# Patient Record
Sex: Female | Born: 1945 | Race: White | Hispanic: No | Marital: Married | Smoking: Never smoker
Health system: Southern US, Community
[De-identification: ages and names within clinical notes are randomized; demographics above are authoritative.]

## PROBLEM LIST (undated history)

## (undated) DIAGNOSIS — I10 Essential (primary) hypertension: Secondary | ICD-10-CM

## (undated) DIAGNOSIS — G309 Alzheimer's disease, unspecified: Secondary | ICD-10-CM

## (undated) DIAGNOSIS — F0280 Dementia in other diseases classified elsewhere without behavioral disturbance: Secondary | ICD-10-CM

## (undated) DIAGNOSIS — K219 Gastro-esophageal reflux disease without esophagitis: Secondary | ICD-10-CM

## (undated) DIAGNOSIS — R569 Unspecified convulsions: Secondary | ICD-10-CM

## (undated) DIAGNOSIS — M1990 Unspecified osteoarthritis, unspecified site: Secondary | ICD-10-CM

## (undated) HISTORY — PX: EYE SURGERY: SHX253

## (undated) HISTORY — PX: COLONOSCOPY: SHX174

## (undated) HISTORY — PX: CHOLECYSTECTOMY: SHX55

## (undated) HISTORY — PX: TONSILLECTOMY: SUR1361

---

## 1999-12-28 HISTORY — PX: CARDIAC CATHETERIZATION: SHX172

## 2000-09-21 ENCOUNTER — Inpatient Hospital Stay (HOSPITAL_COMMUNITY): Admission: EM | Admit: 2000-09-21 | Discharge: 2000-09-23 | Payer: Self-pay

## 2000-09-21 ENCOUNTER — Encounter (INDEPENDENT_AMBULATORY_CARE_PROVIDER_SITE_OTHER): Payer: Self-pay | Admitting: Specialist

## 2000-09-21 ENCOUNTER — Encounter: Payer: Self-pay | Admitting: General Surgery

## 2000-11-27 ENCOUNTER — Encounter: Payer: Self-pay | Admitting: Cardiovascular Disease

## 2000-11-27 ENCOUNTER — Inpatient Hospital Stay (HOSPITAL_COMMUNITY): Admission: EM | Admit: 2000-11-27 | Discharge: 2000-11-28 | Payer: Self-pay | Admitting: Emergency Medicine

## 2003-08-13 ENCOUNTER — Ambulatory Visit (HOSPITAL_BASED_OUTPATIENT_CLINIC_OR_DEPARTMENT_OTHER): Admission: RE | Admit: 2003-08-13 | Discharge: 2003-08-13 | Payer: Self-pay | Admitting: Orthopedic Surgery

## 2003-11-07 ENCOUNTER — Ambulatory Visit (HOSPITAL_BASED_OUTPATIENT_CLINIC_OR_DEPARTMENT_OTHER): Admission: RE | Admit: 2003-11-07 | Discharge: 2003-11-07 | Payer: Self-pay | Admitting: Orthopedic Surgery

## 2003-11-07 ENCOUNTER — Ambulatory Visit (HOSPITAL_COMMUNITY): Admission: RE | Admit: 2003-11-07 | Discharge: 2003-11-07 | Payer: Self-pay | Admitting: Orthopedic Surgery

## 2004-05-22 ENCOUNTER — Ambulatory Visit (HOSPITAL_COMMUNITY): Admission: RE | Admit: 2004-05-22 | Discharge: 2004-05-22 | Payer: Self-pay | Admitting: Family Medicine

## 2004-06-24 ENCOUNTER — Ambulatory Visit (HOSPITAL_COMMUNITY): Admission: RE | Admit: 2004-06-24 | Discharge: 2004-06-24 | Payer: Self-pay | Admitting: Internal Medicine

## 2006-03-30 ENCOUNTER — Ambulatory Visit (HOSPITAL_COMMUNITY): Admission: RE | Admit: 2006-03-30 | Discharge: 2006-03-30 | Payer: Self-pay | Admitting: Family Medicine

## 2006-06-09 ENCOUNTER — Ambulatory Visit (HOSPITAL_COMMUNITY): Admission: RE | Admit: 2006-06-09 | Discharge: 2006-06-09 | Payer: Self-pay | Admitting: Family Medicine

## 2007-12-28 HISTORY — PX: THUMB ARTHROSCOPY: SHX2509

## 2008-07-25 ENCOUNTER — Ambulatory Visit (HOSPITAL_COMMUNITY): Admission: RE | Admit: 2008-07-25 | Discharge: 2008-07-25 | Payer: Self-pay | Admitting: Internal Medicine

## 2008-11-19 ENCOUNTER — Ambulatory Visit (HOSPITAL_BASED_OUTPATIENT_CLINIC_OR_DEPARTMENT_OTHER): Admission: RE | Admit: 2008-11-19 | Discharge: 2008-11-19 | Payer: Self-pay | Admitting: Orthopedic Surgery

## 2008-12-21 ENCOUNTER — Emergency Department (HOSPITAL_COMMUNITY): Admission: EM | Admit: 2008-12-21 | Discharge: 2008-12-21 | Payer: Self-pay | Admitting: Emergency Medicine

## 2009-08-01 ENCOUNTER — Encounter (INDEPENDENT_AMBULATORY_CARE_PROVIDER_SITE_OTHER): Payer: Self-pay | Admitting: *Deleted

## 2009-09-19 DIAGNOSIS — R1310 Dysphagia, unspecified: Secondary | ICD-10-CM | POA: Insufficient documentation

## 2009-09-19 DIAGNOSIS — K219 Gastro-esophageal reflux disease without esophagitis: Secondary | ICD-10-CM | POA: Insufficient documentation

## 2009-09-19 DIAGNOSIS — R1013 Epigastric pain: Secondary | ICD-10-CM | POA: Insufficient documentation

## 2009-09-19 DIAGNOSIS — Z8719 Personal history of other diseases of the digestive system: Secondary | ICD-10-CM | POA: Insufficient documentation

## 2009-09-19 DIAGNOSIS — I10 Essential (primary) hypertension: Secondary | ICD-10-CM | POA: Insufficient documentation

## 2009-09-19 DIAGNOSIS — K649 Unspecified hemorrhoids: Secondary | ICD-10-CM | POA: Insufficient documentation

## 2009-09-22 ENCOUNTER — Ambulatory Visit: Payer: Self-pay | Admitting: Gastroenterology

## 2009-09-22 ENCOUNTER — Encounter: Payer: Self-pay | Admitting: Internal Medicine

## 2009-09-26 ENCOUNTER — Ambulatory Visit: Payer: Self-pay | Admitting: Gastroenterology

## 2009-10-01 ENCOUNTER — Encounter: Payer: Self-pay | Admitting: Internal Medicine

## 2009-10-22 ENCOUNTER — Ambulatory Visit (HOSPITAL_COMMUNITY): Admission: RE | Admit: 2009-10-22 | Discharge: 2009-10-22 | Payer: Self-pay | Admitting: Internal Medicine

## 2009-10-22 ENCOUNTER — Ambulatory Visit: Payer: Self-pay | Admitting: Internal Medicine

## 2009-10-22 ENCOUNTER — Encounter: Payer: Self-pay | Admitting: Internal Medicine

## 2009-10-26 ENCOUNTER — Encounter: Payer: Self-pay | Admitting: Internal Medicine

## 2011-01-28 NOTE — Letter (Signed)
Summary: Generic Letter, Intro to Referring  [facility]  [address]   [place], [place] [address]   Phone: [phone]  Fax: [phone]      August 01, 2009             RE: [name]   Feb 08, 1946                 [address]                 [place], [place]  [address]  Dear Appt Secretary,     This pt has been scheduled an appt for 09/19/2009 @ 10:00 w/ Dr. Jena Gauss.   Lmom with appt info and for a return call.        Sincerely,    Elinor Parkinson  [facility] Ph: [phone]   Fax: [phone]

## 2011-01-28 NOTE — Letter (Signed)
Summary: TCS POSS EGD ORDER  TCS POSS EGD ORDER   Imported By: Elinor Parkinson 10/01/2009 12:25:56  _____________________________________________________________________  External Attachment:    Type:   Image     Comment:   External Document

## 2011-01-28 NOTE — Assessment & Plan Note (Signed)
Summary: FOBT/AMS  pt returned ifobt and it was positive  Hendricks Limes LPN  Allergies: 1)  ! Tylenol  Other Orders: Immuno-chemical Fecal Occult (06237) Recommend TCS +/-EGD with RMR for positive ifobt.  Please arrange if patient agreeable.    Appended Document: FOBT/AMS LMOM to call.  Appended Document: FOBT/AMS Pt aware and agrees.  Appended Document: FOBT/AMS Pt is scheduled on 10/22/09@10 :00am Pt aware of appt.

## 2011-01-28 NOTE — Letter (Signed)
Summary: Patient Notice, Colon Biopsy Results  [facility]  [address]   [place], [place] [address]   Phone: [phone]  Fax: [phone]       October 26, 2009   [name] [address] [place], [place]  [address] 1946/04/21    Dear [name],  I am pleased to inform you that the biopsies taken during your recent colonoscopy did not show any evidence of cancer upon pathologic examination.  Additional information/recommendations:  You should have a repeat colonoscopy examination  in 5 years.  Please call us if you are having persistent problems or have questions about your condition that have not been fully answered at this time.  Sincerely,    R. Roetta Sessions MD  [facility] Ph: [phone]    Fax: [phone]   Appended Document: Patient Notice, Colon Biopsy Results Letter mailed to pt.

## 2011-01-28 NOTE — Assessment & Plan Note (Signed)
Summary: DYSPEPSIA,CONSULT FOR COLONOSCOPY/AMS   Visit Type:  Initial Consult Referring Provider:  Cresenzo Primary Care Provider:  Cresenzo  Chief Complaint:  dyspepsia.  History of Present Illness: [name] is a pleasant 65 year old lady, referred by Lowell Guitar, PA-C, for further evaluation of dyspepsia/GERD, screening colonoscopy. She presented to her PCP on 07/31/09 with several week h/o burning in her esophagus and feeling like food would not go down. She has tried Mylanta or Maalox, Zantac all which gave her temporary relief. She's had no chest pain or shortness of breath. She was started on omeprazole 20 mg daily.    She feels much better on omeprazole. She's had no further heartburn or problem swallowing. If she overeats she will develop regurgitation. She denies any abdominal pain or weight loss. She consumes a high fiber diet which helps with her constipation. She's had no change in her bowel movements. Denies any blood in the stool or melena.  Reviewed labs from July 31, 2009. CBC normal, LFTs normal, TSH normal, H. pyloric serology was negative.  Current Medications (verified): 1)  Ziac 2.5-6.25 Mg Tabs (Bisoprolol-Hydrochlorothiazide) .... Once Daily 2)  Pepcid Ac 10 Mg Tabs (Famotidine) .... Once Daily As Needed 3)  Omeprazole 20 Mg Cpdr (Omeprazole) .... Take 1 Tablet By Mouth Once A Day  Allergies (verified): 1)  ! Tylenol  Past History:  Past Medical History: GERD Hypertension EGD, 2005, Dr. Jena Gauss.  Normal, status post empirical dilation. Colonoscopy, 2005, Dr. Jena Gauss. Shallow, few left-sided diverticula, polyp at the appendiceal orifice showed inflammation only.  Past Surgical History:  Bilateral cataract extraction Appendectomy Cholecystectomy Tonsillectomy  Family History: Mother and father deceased age 73 and 55 due to CHF. Sister, lung cancer. 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  Illicit Drug Use - no Smoking Status:  never Drug Use:  no  Review of Systems General:  Denies fever, chills, sweats, weakness, and weight loss. Eyes:  Denies vision loss. ENT:  Denies nasal congestion, sore throat, hoarseness, and difficulty swallowing. CV:  Denies chest pains, angina, palpitations, dyspnea on exertion, and peripheral edema. Resp:  Denies dyspnea at rest, dyspnea with exercise, and cough. GI:  See HPI. GU:  Denies urinary burning and blood in urine. MS:  Denies joint pain / LOM. Derm:  Denies rash and itching. Neuro:  Denies frequent headaches, memory loss, and confusion. Psych:  Denies depression and anxiety. Endo:  Denies unusual weight change. Heme:  Denies bruising and bleeding. Allergy:  Denies hives and rash.  Vital Signs:  Patient profile:   65 year old female Height:      62 inches Weight:      177 pounds BMI:     32.49 Temp:     97.7 degrees F oral Pulse rate:   56 / minute BP sitting:   120 / 78  (right arm) Cuff size:   regular  Vitals Entered By: Cloria Spring LPN (September 22, 2009 1:14 PM)  Physical Exam  General:  Well developed, well nourished, no acute distress. Head:  Normocephalic and atraumatic. Eyes:  Conjunctivae pink, no scleral icterus.  Mouth:  Oropharyngeal mucosa moist, pink.  No lesions, erythema or exudate.    Neck:  Supple; no masses or thyromegaly. Lungs:  Clear throughout to auscultation. Heart:  Regular rate and rhythm; no murmurs, rubs,  or bruits. Abdomen:  Bowel sounds normal.  Abdomen is soft, nontender, nondistended.  No rebound or  guarding.  No hepatosplenomegaly, masses or hernias.  No abdominal bruits.  Extremities:  No clubbing, cyanosis, edema or deformities noted. Neurologic:  Alert and  oriented x4;  grossly normal neurologically. Skin:  Intact without significant lesions or rashes. Cervical Nodes:  No significant cervical adenopathy. Psych:  Alert and cooperative.  Normal mood and affect.  Impression & Recommendations:  Problem # 1:  GERD (ICD-530.81)  Intermittent gastroesophageal reflux disease with recent flare up of symptoms. She did have some subjective dysphagia. Now on omeprazole symptoms have resolved. Recommend continuing omeprazole at least for 6 months. At that time she can try to use on a p.r.n. basis or every other day. Since she had an EGD in 2005 and she does not have any alarm symptoms at this time, would not pursue EGD.  Orders: Consultation Level III ([address])  Problem # 2:  SPECIAL SCREENING FOR MALIGNANT NEOPLASMS COLON (ICD-V76.51)  Patient presents for consideration of first time screening colonoscopy. She denies any bowel symptoms. She actually has had previous TCS.  Her last colonoscopy was in 2005 at which time she had inflammatory polyp and few diverticula. Family history is negative for colon cancer or colon polyps. Patient has never had adenomatous polyps.  Therefore, her next colonoscopy should be in 2015 unless she were to develop change in bowels, bleeding, IDA. Recent labs show normal hemoglobin. We'll check iFOBT for occult GI bleeding.  Orders: Consultation Level III [id number])

## 2011-05-11 NOTE — Op Note (Signed)
[name], [name]                ACCOUNT NO.:  [id number]   MEDICAL RECORD NO.:  [id number]          PATIENT TYPE:  AMB   LOCATION:  DSC                          FACILITY:  [facility]   PHYSICIAN:  Katy Fitch. Sypher, M.D. DATE OF BIRTH:  11-16-1946   DATE OF PROCEDURE:  11/19/2008  DATE OF DISCHARGE:                               OPERATIVE REPORT   PREOPERATIVE DIAGNOSIS:  Painful unstable left thumb interphalangeal  joint with large loose body and significant radial deviation deformity.   POSTOPERATIVE DIAGNOSIS:  Painful unstable left thumb interphalangeal  joint with large loose body and significant radial deviation deformity.   OPERATIONS:  1. Debridement of large loose body and resection of osteophytes left      thumb interphalangeal joint.  2. Arthrodesis of left thumb interphalangeal joint in 5 degrees      flexion, slight pronation and neutral deviation utilizing a 28-mm      Mini-Acutrak screw utilizing local bone graft harvested from the      articular loose body after removal of hyaline cartilage and from      the proximal phalangeal head osteophytes after removal of hyaline      cartilage.   SURGEON:  Katy Fitch. Sypher, MD   ASSISTANT:  Annye Rusk PA-C   ANESTHESIA:  General by LMA.   SUPERVISING ANESTHESIOLOGIST:  Germaine Pomfret, MD   INDICATIONS:  [name] is a 65 year old woman with a history of  severe arthritis.  She has a history of bilateral IP joint pain and  instability.  Due to the end-stage degenerative arthritis she was  experiencing and due to pinch prehension impairment, we offered  arthrodesis of her left thumb IP joint in an effort to control her pain,  improve her pinch prehension and relieve her deformity.   She decided to proceed with arthrodesis of her left thumb  interphalangeal joint at this time.   Preoperatively, she was interviewed by Dr. Jean Rosenthal.  General anesthesia  by LMA technique was recommended.  After questions were  invited and  answered, she is brought to room 6 at this time anticipating arthrodesis  of her left thumb IP joint.   PROCEDURE:  [name] is brought to the operating room and placed in  a supine position upon the operating table.  Following the induction of  general anesthesia by LMA technique under Dr. Edison Pace direct  supervision, her left arm was prepped with Betadine soap solution and  sterilely draped.  Ancef 1 g had been administered in the holding area  as an IV prophylactic antibiotic.  The left arm was exsanguinated with  an Esmarch bandage and the arterial tourniquet on the proximal left  brachium inflated to 220 mmHg.  The procedure commenced with a lazy-S  incision exposing the extensor mechanism of the left thumb IP joint.  The deformity of the IP joint due to a large dorsal loose body and large  marginal osteophytes was immediately evident.  The extensor tendon was  transected 4 mm proximal to its insertion followed by opening the joint  in shotgun style by release of the  collateral ligaments.  The large  osteophyte was extracted from the extensor tendon dorsally followed by  debridement and synovectomy of the interphalangeal joint.  The marginal  osteophytes on the proximal phalangeal head were sculpted carefully with  a medium-sized and small rongeur to a bullet shape carefully preserving  the osteophytes for harvesting of cancellous graft.  The base of the  distal phalanx was exposed by gentle dissection with a fine rongeur and  small osteotome followed by use of a power bur to create a cup.  The  joint was positioned in neutral deviation, 5 degrees flexion and slight  pronation followed by placement of a 0.045-inch Kirschner wire with  retrograde technique.  A second K-wire was used to measure length  followed by selection of a 28-mm headless mini Acutrak screw.  This was  placed in standard technique through the distal phalanx into the  proximal phalanx with  excellent cortical purchase in each phalanx.  A  very satisfactory arthrodesis was achieved.  AP, lateral and oblique C-  arm images were obtained confirming excellent position of the joint and  the internal fixation hardware.   The bone graft was morselized and denuded of all hyaline cartilage  followed by packing around the margins of the articular space.  The  extensor tendon was then repaired with a series of core sutures of 3-0  Ethibond followed by repair of the skin with trauma sutures of 5-0  nylon.  The tourniquet was released with immediate capillary refill.  [name] was then placed in compressive dressing with Xeroflo, sterile  gauze, sterile Webril and a forearm-based thumb spica splint.      Katy Fitch Sypher, M.D.  Electronically Signed     RVS/MEDQ  D:  11/19/2008  T:  11/20/2008  Job:  [id number]   cc:   Patrica Duel, M.D.

## 2011-05-14 NOTE — Cardiovascular Report (Signed)
[name]  Patient:    [name], [name]                       MRN: [id number] Proc. Date: 11/28/00 Adm. Date:  [id number] Attending:  Berry, Jonathan [place] CC:         Cardiac Catheterization Laboratory  Earl Lites, M.D., Urgent Care  [facility], [address], [place], [place] [address]   Cardiac Catheterization  INDICATIONS:  [name] is a 65 year old married white female, mother of four, who sorts mail for a living.  She has no prior cardiac history.  Her risk factors include hypertension and hyperlipidemia.  She had an inadequate GXT by Dr. Caprice Kluver last year and was scheduled for Cardiolite stress test which he did not perform.  She has had recurrent, progressive chest pain and shortness of breath and was scheduled for a Cardiolite later this week; however, she was seen at Urgent Care with these symptoms and was transferred to [facility] for further evaluation and diagnostic coronary arteriography.  DESCRIPTION OF PROCEDURE:  The patient was brought to the second floor [place] [facility] Catheterization Lab in the postabsorptive state.  She was premedicated with p.o. Valium and IV Versed.  Her right groin was prepped and shaved in the normal sterile fashion.  Xylocaine 1% was used for local anesthesia.  A 6 French sheath was inserted into the right femoral artery using standard Seldinger technique.  A 6 French right and left Judkins catheter as well as a 6 French pigtail catheter were used for selective coronary angiography, left ventriculography, and a distal abdominal aortography.  Omnipaque dye was used for the entirety of the case. Retrograde, aortic, left ventricular and pullback pressures were recorded.  HEMODYNAMICS: 1. Aortic systolic pressure 133, diastolic pressure 76. 2. Left ventricular systolic pressure 136, and diastolic pressure 15.  SELECTIVE CORONARY ANGIOGRAPHY: 1. Left main:  Normal. 2. Left  anterior descending:  The LAD is normal. 3. Left circumflex:  Left circumflex was dominant and normal. 4. Right coronary artery:  Right coronary artery is nondominant and was    normal.  LEFT VENTRICULOGRAPHY:  The RAO left ventriculogram was performed using 20 cc of Omnipaque dye at 10 cc/sec.  The overall LVEF was measured by the automatic ______ ______ shortening technique at 74%.  There were no focal wall motion abnormalities noted.  DISTAL ABDOMINAL AORTOGRAPHY:  Distal abdominal aortogram was performed using 20 cc of Omnipaque dye at 20 cc/sec.  The renal arteries were widely patent. The infrarenal abdominal aorta and iliac bifurcation appear free of significant atherosclerotic changes.  IMPRESSION:  Ms. Settle has normal coronary arteries and normal left ventricular function.  I believe her symptoms are noncardiac and most likely related to deconditioning +/- anxiety, +/- a component of reflux.  An ACT was measured and the sheaths were removed.  Pressure was held on the groin to achieve hemostasis.  The patient left the lab in stable condition.  She will be discharged home later today and will see me back in the office in two weeks for follow-up.  Dr. Madelyn Brunner office is notified of these results. DD:  11/28/00 TD:  11/28/00 Job: [id number]

## 2011-05-14 NOTE — Op Note (Signed)
NAME:  [name], [name]                          ACCOUNT NO.:  [id number]   MEDICAL RECORD NO.:  [id number]                   PATIENT TYPE:  AMB   LOCATION:  DAY                                  FACILITY:  [facility]   PHYSICIAN:  R. Roetta Sessions, M.D.              DATE OF BIRTH:  1946/04/02   DATE OF PROCEDURE:  06/24/2004  DATE OF DISCHARGE:                                 OPERATIVE REPORT   PROCEDURE:  Esophagogastroduodenoscopy with Elease Hashimoto dilation followed by  colonoscopy with biopsy.   ENDOSCOPIST:  Gerrit Friends. Rourk, M.D.   INDICATIONS FOR PROCEDURE:  The patient is a 65 year old lady with recent  esophageal dysphagia in the setting of poorly controlled gastroesophageal  reflux disease symptoms.  She has also had low volume hematochezia  EGD and  colonoscopy are now being done.  The approach has been discussed with the  patient at length.  The potential risks, benefits, and alternatives have  been reviewed.  Please see my dictated H&P for more information.   PROCEDURE NOTE:  O2 saturation, blood pressure, pulse and respirations were  monitored throughout the entirety of both procedures.  Conscious sedation:  Versed 2 mg IV, Demerol 50 mg IV for the above procedures. Cetacaine spray  for topical oropharyngeal anesthesia.   INSTRUMENT:  Olympus video chip system.   FINDINGS:  Examination of the tubular esophagus revealed no mucosal  abnormalities.  The EG junction was easily traversed.   STOMACH:  The gastric cavity was empty.  It insufflated well with air.  A  thorough examination of the gastric mucosa including a retroflex view of the  proximal stomach and esophagogastric junction demonstrated no abnormalities.  The pylorus was patent and easily traversed.  Examination of the bulb and  second portion revealed no abnormalities.   THERAPEUTIC/DIAGNOSTIC MANEUVERS:  A 56 French Maloney dilator was passed to  full insertion with ease. A look back revealed no apparent complication  related to passage of the dilator.   The patient tolerated the procedure well and was prepared for colonoscopy.   FINDINGS:  A digital rectal exam revealed no abnormalities.   ENDOSCOPIC FINDINGS:  The prep was good.   RECTUM:  Examination of the rectal mucosa including a good en face view of  the anal canal demonstrated only anal canal hemorrhoids. I was unable to  retroflex because the rectal vault was somewhat narrow.  The mucosa;  however, was seen well.   COLON:  The colonic mucosa was surveyed from the rectosigmoid junction  through the left transverse and right colon to the area of the appendiceal  orifice, ileocecal valve, and cecum.  These structures were well seen and  photographed for the record.   From this level the scope was slowly withdrawn.  All previously mentioned  mucosal surfaces were again seen.  The patient had a few scattered, shallow,  left-sided diverticula; and there was a 3 mm cecal  polyp adjacent to the  appendix which was cold biopsied/removed.   The patient tolerated the procedures well and was reacted in endoscopy   EGD IMPRESSION:  1. Normal esophagus, stomach, D1-D2.  2. Status post passage of a 2 French Maloney dilator.   COLONOSCOPY FINDINGS:  1. Anal canal hemorrhoids, otherwise normal rectum.  2. Shallow, few left-sided diverticula.  3. Polyp at the appendiceal orifice cold biopsied/removed.  4. The remainder of the colonic mucosa appeared normal.   RECOMMENDATIONS:  1. Stop Protonix, begin Aciphex 20 mg orally daily.  She is to go by my     office for free samples.  2. Antireflux literature provided to Ms. Falzon.  3. Hemorrhoid literature provided to Ms. Quigg.  4. A 10-day course of Anusol suppositories, 1 per rectum at bedtime  5. Follow up appointment with [place] in 3 months.  6. Follow up on path.      ___________________________________________                                            Jonathon Bellows, M.D.   RMR/MEDQ  D:   06/24/2004  T:  06/24/2004  Job:  [id number]   cc:   Madelin Rear. Sherwood Gambler, M.D.  [address]  [place]  [place] [address]  Fax: [phone]   R. Roetta Sessions, M.D.  [address]  [place]  [place] [address]  Fax: [phone]

## 2011-05-14 NOTE — Op Note (Signed)
[facility]  Patient:    [name], [name]                       MRN: [id number] Proc. Date: 09/21/00 Adm. Date:  [id number] Attending:  Brandy Hale CC:         Eula Listen, M.D., [facility], [address]   Operative Report  PREOPERATIVE DIAGNOSIS:  POSTOPERATIVE DIAGNOSIS:  PROCEDURE:  SURGEON:  Angelia Mould. Derrell Lolling, M.D.  ANESTHESIA:  OPERATIVE INDICATIONS:  This is a 65 year old white female who presents with a 24-hour history of mid abdominal pain.  The pain has been constant in intensity and has become more localized to the right lower quadrant today. She denies nausea, vomiting, fever, or chills.  She has had a little bit of diarrhea and has seen some blood in her stools off and on, and in fact, saw some today.  On examination, she had localized tenderness and guarding in the right lower quadrant.  Her stools were a little bit loose, but were Hemoccult positive.  She attributed this to hemorrhoids.  White blood cell count was 12,700.  We ahead with a CT scan because of the atypical presentation, but the CT scan was most consistent with acute appendicitis.  She is brought to the operating room urgently for appendectomy.  OPERATIVE FINDINGS:  The patient had acute appendicitis.  The appendix was distended, thickened, inflamed, and had an exudate on it.  There was no evidence of rupture and no evidence of abscess.  The terminal ileum and cecum looked normal.  The uterus was of normal shape and contour, but was slightly enlarged.  I did not detect any ovarian mass.  The liver looked normal.  DESCRIPTION OF PROCEDURE:  Following the induction of general endotracheal anesthesia, a Foley catheter was inserted, and the patients abdomen was prepped and draped in a sterile fashion.  Point five percent Marcaine with epinephrine was used as a local infiltration anesthetic.  A transverse incision was made at the superior rim of the  umbilicus.  The fascia was incised in the midline and the abdominal cavity entered under direct vision. There were some adhesions right under the umbilicus.  These were simple adhesions of omentum and they were taken down.  A 10 mm Hassan trocar was inserted and secured with a pursestring suture of 0 Vicryl.  Pneumoperitoneum was created.  Video camera was inserted with visualization and findings as described above.  The patient was positioned in the Trendelenburg position and rotated to the left.  A 5 mm trocar was placed in the right upper quadrant and a 12 mm trocar placed in the left suprapubic area.  The appendix was controlled with an Endoloop tie and elevated.  We took some adhesions down and divided some of the lateral peritoneum.  The mesoappendix was isolated and separated from the appendix, and the mesoappendix and the appendiceal artery were controlled with Endo-GIA stapling device.  We were then able to easily dissect the appendix all the way to its insertion on the cecum and visualized this area quite nicely.  The appendix was transected and controlled with an Endo-GIA stapling device.  The appendix was placed in the specimen bag and removed.  The operative field was copiously irrigated with saline.  There was no bleeding at the completion of the case.  The staple line on the cecum looked quite secure.  After we thoroughly irrigated the operative field, the trocars were removed under direct vision  and there was no bleeding from the trocar sites.  The pneumoperitoneum was released.  The fascia at the umbilicus and the fascia and the left suprapubic trocar site were closed with 0 Vicryl sutures.  The wounds were irrigated with saline and the skin closed with subcuticular sutures of 4-0 Vicryl and Steri-Strips.  Clean bandages were placed and the patient taken to the recovery room in stable condition.  Estimated blood loss was about 20 cc.  Complications none. Sponge, needle,  and instrument counts were correct. DD:  09/21/00 TD:  09/22/00 Job: [id number]

## 2011-05-14 NOTE — Op Note (Signed)
   NAME:  [name], [name]                          ACCOUNT NO.:  [id number]   MEDICAL RECORD NO.:  [id number]                   PATIENT TYPE:  AMB   LOCATION:  DSC                                  FACILITY:  [facility]   PHYSICIAN:  Katy Fitch. Naaman Plummer., M.D.          DATE OF BIRTH:  05-04-1946   DATE OF PROCEDURE:  11/07/2003  DATE OF DISCHARGE:                                 OPERATIVE REPORT   PREOPERATIVE DIAGNOSES:  Chronic entrapment neuropathy of median nerve,  right carpal tunnel.   POSTOPERATIVE DIAGNOSES:  Chronic entrapment neuropathy of median nerve,  right carpal tunnel.   OPERATION PERFORMED:  Release of right transverse carpal ligament.   SURGEON:  Katy Fitch. Sypher, M.D.   ASSISTANT:  Jonni Sanger, P.A.   ANESTHESIA:  IV regional, forearm level supplemented by sedation.   SUPERVISING ANESTHESIOLOGIST:  Sheldon Silvan, M.D.   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  Electrodiagnostic studies by  Santiago Glad in October 2003 documented moderately severe bilateral  carpal tunnel syndrome.  Due to failure to respond to nonoperative measures,  she is status post release of her left transverse carpal ligament in August  2004 and now returns for release of her right transverse carpal ligament.   DESCRIPTION OF PROCEDURE:  [name] was brought to the operating room  and placed in supine position upon the operating table.  Following light  sedation, the right arm was anesthetized with a forearm level IV regional.  When anesthesia was satisfactory, the right arm was prepped with Betadine  soap and solution and sterilely draped.  The procedure commenced with a  short incision in line with the ring finger in the palm.  Subcutaneous  tissues are carefully divided revealing the palmar fascia.  This was split  longitudinally to reveal the common sensory branch of the median nerve.  These were  followed back to the transverse carpal ligament, which was  carefully isolated from the median nerve.  The ligament was released on its  ulnar border extending to the distal forearm.  This widely opened the carpal  canal.  No masses or other predicaments were noted.  Bleeding points along  the margin of the released ligament were electrocauterized with bipolar  current followed by repair of the skin with intradermal 3-0 Prolene suture.  A compressive dressing was applied with a volar forearm plaster splint  maintaining the wrist in five degrees dorsiflexion.                                               Katy Fitch Naaman Plummer., M.D.    RVS/MEDQ  D:  11/07/2003  T:  11/07/2003  Job:  [id number]

## 2011-05-14 NOTE — Consult Note (Signed)
NAME:  [name], George                          ACCOUNT NO.:  [id number]   MEDICAL RECORD NO.:  [id number]                  PATIENT TYPE:   LOCATION:                                       FACILITY:  [facility]   PHYSICIAN:  R. Roetta Sessions, M.D.              DATE OF BIRTH:  12/10/1946   DATE OF CONSULTATION:  06/18/2004  DATE OF DISCHARGE:                                   CONSULTATION   GASTROENTEROLOGY CONSULTATION:   REQUESTING PHYSICIAN:  Kirk Ruths, M.D.   REASON FOR CONSULTATION:  One-month history of dysphagia and epigastric  pain.   HISTORY OF PRESENT ILLNESS:  [name] is a 65 year old Caucasian female  who presents to our office with a 53-month history of dysphagia to both solid  and liquid foods.  She also noted one episode of sharp epigastric pain which  was postprandial.  She also complains of GERD symptoms including  regurgitation, water brash, heartburn, and dyspepsia.  She was given  Protonix 40 mg daily for about 2 weeks which resulted in about 60% relief of  her symptoms.  She had some Pepcid which she has been using intermittently  as well.  She denies any nausea or vomiting.  As far as the dysphagia is  concerned she notes feels like food is sticking.  This has been relieved  some with the Protonix as well.  Bowel movements have been normal, soft and  brown and on a daily basis or twice per day.  She does report a history of  small volume intermittent hematochezia on the toilet paper which she  attributed to hemorrhoids only once in the past.  She denies any melena.  She has had colonoscopy but it has been more than 10 years ago per her  report.   PAST MEDICAL HISTORY:  Hypertension.   PAST SURGICAL HISTORY:  1. Appendectomy 2 years ago.  2. Cholecystectomy 10 years ago by Dr. Johna Sheriff secondary to cholelithiasis.  3. Bilateral cataract repair.  4. Cardiac cath negative in [place] within the last 2 years.   CURRENT MEDICATIONS:  1. Ziac 2.5/6.25  mg daily.  2. Pepcid 10 mg p.r.n.  3. Protonix 40 mg daily.   ALLERGIES:  No known drug allergies.   FAMILY HISTORY:  No known family history of colorectal carcinoma, liver  problems, she does report one sister with history of lung carcinoma with  some esophageal lesions as well however, she does not exact details on this.  Mother and father ages 24 and 10 deceased secondary to CHF.  She has  multiple siblings with family history significant for hypertension and  diabetes mellitus.   SOCIAL HISTORY:  Mrs. Sonn has been married for 40 years.  She has four  grown healthy children.  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  She denies any tobacco use.  She reports one drink per week alcohol  consumption.  She denies any drug use.   REVIEW OF SYSTEMS:  CONSTITUTIONAL:  Weight is stable.  Appetite is okay.  Denies any fatigue.  CARDIOVASCULAR:  Reports negative cardiac workup  including cardiac cath recently within the last year or two.  Denies any  chest pain or palpitations currently.  PULMONARY:  She does complain of some  shortness of breath on exertion and a heavy sensation to her chest.  This  has been relieved some with Protonix.  She denies any cough, dyspnea, or  hemoptysis.   PHYSICAL EXAMINATION:  VITAL SIGNS:  Weight 165 pounds, height 62.5 inches,  blood pressure 130/90, pulse 64.  GENERAL:  [name] is a 65 year old well-developed, well-nourished  Caucasian female in no acute distress.  HEENT:  Sclerae are clear, nonicteric.  Conjunctivae pink.  Oropharynx pink  and moist without any lesion.  NECK:  Supple without any thyromegaly or lymphadenopathy.  She does have a  less than 1 cm variegated brown and black nevus to her right lateral neck.  CHEST:  Heart regular rate and rhythm with normal S1, S2, without any  murmurs, clicks, rubs, or gallops.  LUNGS:  Clear to auscultation bilaterally.  ABDOMEN:  Positive bowel sounds x4, appropriate well-healed  status post  cholecystectomy and appendectomy scars.  No bruits auscultated.  Soft,  nontender, nondistended, without any palpable mass or hepatosplenomegaly.  No rebound tenderness or guarding.  EXTREMITIES:  Good pedal pulses bilaterally.  No edema.   ASSESSMENT:  1. [name] is a 66 year old Caucasian female with 52-month history of     dysphagia to both solids and liquids associated with acute exacerbation     of gastroesophageal reflux disease symptoms and a short-lived episode of     epigastric pain.  [name]' symptoms are suspicious for a     gastroesophageal reflux disease exacerbation and she may have developed     reflux esophagitis as well which has caused her some dysphagia.  She has     had only 60% relief with proton pump inhibitor therapy and given her age     would warrant further evaluation.  She may have developed complications     of chronic unrecognized gastroesophageal reflux disease including web,     ring, stricture, or Barrett's esophagus.  2. She does have what appears to be a dysplastic nevus to right side of her     neck and this should be evaluated by dermatologist.   RECOMMENDATIONS:  1. We will schedule EGD with possible ED if necessary in the near future     with Dr. Jena Gauss.  I have discussed this procedure including risks and     benefits which include but are not limited to bleeding, infection, and     drug reaction.  She agrees with this plan, consent will be obtained.  2. She should remain on Protonix 40 mg daily.  3. She is to schedule appointment with dermatologist.  I have recommended     Dr. Margo Aye.  4. Further recommendations pending procedure.     ________________________________________  ___________________________________________  Nicholas Lose, N.P.                  Jonathon Bellows, M.D.   KC/MEDQ  D:  06/18/2004  T:  06/19/2004  Job:  [id number]   cc:   Madelin Rear. Sherwood Gambler, M.D.  [address]  [place]  [place] [address] Fax:  [phone]

## 2011-05-14 NOTE — Discharge Summary (Signed)
[place]  Patient:    [name], [name]                       MRN: [id number] Adm. Date:  [id number] Disc. Date: [id number] Attending:  Berry, Jonathan [place] Dictator:   Mancel Bale, P.A. CC:         Thereasa Solo. Little, M.D., Cardiology             Runell Gess, M.D., [facility]                           Discharge Summary  ADMISSION DIAGNOSES: 1. Chest pain, questionable etiology. 2. Hypertension. 3. Hyperlipidemia. 4. Non-insulin-dependent diabetes mellitus. 5. History of gastroesophageal reflux disease. 6. History of appendectomy. 7. History of cholecystectomy.  DISCHARGE DIAGNOSES: 1. Chest pain, questionable etiology. 2. Hypertension. 3. Hyperlipidemia. 4. Non-insulin-dependent diabetes mellitus. 5. Status post cardiac catheterization November 28, 2000 by    Dr. Nanetta Batty, which showed normal coronary arteries, normal left    ventricular function, and normal abdominal aorta. 6. History of gastroesophageal reflux disease. 7. History of appendectomy. 8. History of cholecystectomy.  HISTORY OF PRESENT ILLNESS:  [name] is a 65 year old married white female mother of four who had no prior cardiac history.  She did have cardiac risk factors for hypertension, hyperlipidemia, diabetes, but no tobacco use.  She had an inadequate stress test by Dr. Caprice Kluver last year.  She had been scheduled for a Cardiolite but did not go.  She experienced recurrent chest pain with dyspnea on exertion recently and was scheduled for a Cardiolite later in the week; however, her chest pain and dyspnea on exertion had increased.  She went to urgent care secondary to her chest pain.  She was treated with sublingual nitroglycerin.  She had no EKG changes.  She was then transferred to [facility].  At the time of interview in the ER, she was currently pain free.  Her past medical history was otherwise significant for  gastroesophageal reflux disease and she was status post appendectomy and cholecystectomy in the past.  On exam, her blood pressure was 160/80, pulse 80.  Exam was essentially benign.  EKG showed sinus bradycardia at 56 beats per minute without ST-T changes.  Chest x-ray showed no acute disease and labs were pending at that time.  At that time, her symptoms were worrisome for possible unstable angina, especially given her positive cardiac risk factors.  She was treated with aspirin and Plavix, beta-blocker, IV heparin, nitroglycerin, and was planned for a cardiac catheterization.  HOSPITAL COURSE:  On November 28, 2000, [name] underwent cardiac catheterization by Dr. Nanetta Batty.  She was found to have normal coronary arteries, normal LV function, and normal abdominal aorta.  It was felt that her symptoms were noncardiac and she was planned for discharge home later in the day with office follow-up.  CONSULTATIONS:  None.  PROCEDURES:  Cardiac catheterization on November 28, 2000 by Dr. Nanetta Batty revealing normal coronary arteries, normal LV function, and normal abdominal aorta.  LABORATORY DATA:  On admission, CBC showed a WBC of 5.8, hemoglobin 13.7, hematocrit 39.2, platelets 210.  Electrolytes showed a sodium of 143, potassium 4.4, chloride 106, carbon dioxide 30, glucose 109, BUN 15, creatinine 0.9.  LFTs were normal.  PT 12.0, INR 0.9.  Cardiac enzymes showed a CK of 88, MB 1.4, troponin 0.01.  EKG  on admission showed sinus bradycardia, 56 beats per minute.  No ST-T change.  RADIOLOGY:  Chest x-ray on admission showed no acute disease.  DISCHARGE MEDICATIONS:  Toprol as before.  ACTIVITY:  No strenuous activity, lifting greater than 5 pounds, driving, or sexual activity for three days.  WOUND CARE:  May gently wash her groin with warm water and soap.  DISCHARGE INSTRUCTIONS:  Call the office at [phone] if any bleeding or increased size or pain of the  groin.  FOLLOW-UP:  Follow up in the [facility] office December 15, 2000 at 11:45 with Dr. Allyson Sabal. DD:  12/21/00 TD:  12/21/00 Job: [id number]

## 2011-05-14 NOTE — H&P (Signed)
[facility]  Patient:    [name], [name]                         MRN: [id number] Adm. Date:  09/21/00 Attending:  Angelia Mould. Derrell Lolling, M.D. CC:         Eula Listen, M.D.  [facility]  [address]  [place], [place]   History and Physical  CHIEF COMPLAINT:  Lower abdominal pain and blood in stools.  HISTORY OF PRESENT ILLNESS:  This is a 65 year old white female who notice the onset of mid abdominal pain after supper last night.  The pain has been constant and has been getting worse.  The pain is more localized to the right lower quadrant when she is examined.  She denies nausea, vomiting, fever or chills.  She states that she usually has normal bowel movements, but has passed some low volume bright red blood in the past.  She had diarrhea today, which she attributes to taking a laxative last night.  She saw  a small amount of bright red blood today in the stool.  She does not have any rectal pain today, but has had some of that in the past.  She has no prior history of gastrointestinal disease.  She has not ever had a gastrointestinal workup before.  She was evaluated by Dr. Eula Listen and, at their office, she thought she had right lower quadrant tenderness, and blood work showed a white blood cell count of 12,000.  He was concerned about appendicitis and called [facility]I to evaluate her.  PAST MEDICAL HISTORY:  Laparoscopic cholecystectomy by Dr. Johna Sheriff in 1995. Cataract surgery in the past.  Tonsillectomy in 1985.  She has hypertension. She is not treated for that.  She has stress incontinence.  She has had four pregnancies and four deliveries.  CURRENT MEDICATIONS:  Tylenol P.M. occasionally.  She takes no prescription medications.  DRUG ALLERGIES:  None known.  FAMILY HISTORY:  Her mother deceased at the age of 33.  She had diabetes, congestive heart failure and hypertension.  Her father deceased at the age of 38.  He had a  stroke and hypertension.  One brother deceased of coronary artery disease.  Five other siblings living.  One is on dialysis and one has diabetes.  REVIEW OF SYSTEMS:  All systems are reviewed and are negative except as described above.  PHYSICAL EXAMINATION:  GENERAL:  Somewhat overweight middle aged white female in mild distress. SHe is cooperative.  VITAL SIGNS:  Blood pressure 165/100, heart rate 89 and regular, respiratory rate 16, temperature 96.7.  HEENT:  Sclerae clear.  Extraocular movements intact.  Oropharynx clear.  NECK:  Supple.  Nontender.  No thyromegaly.  No adenopathy.  No mass.  No bruits.  LUNGS:  Clear to auscultation.  No CVA tenderness.  HEART:  Regular rate and rhythm.  BREASTS:  Not examined.  ABDOMEN:  Slightly obese.  Soft.  Hypoactive bowel sounds.  Localized guarding and tenderness in the right lower quadrant more so than the suprapubic area. I do not detect a mass.  GENITALIA:  No inguinal adenopathy.  PELVIC:  Unremarkable according to Dr. Althea Charon.  RECTAL:  No external abnormalities.  No external hemorrhoids or inflammatory changes.  Digitorectal exam reveals normal sphincter tone.  No palpable mass. Stool is loose and brown with some dark blood in it.  Grossly hemoccult positive.  EXTREMITIES:  No edema.  Good pulses.  NEUROLOGIC:  Grossly within normal  limits.  LABORATORY DATA:  Complete metabolic panel normal except cholesterol 260.  CBC shows white count 12,700, hemoglobin 14.1.  Urinalysis is normal.  IMPRESSION: 1. Lower abdominal pain and hematochezia.  Physical findings certainly are    consistent with appendicitis but I think, because of the blood in the    stool, we need to rule out ileitis and colitis.  I doubt that she has    ovarian pathology. 2. Hypertension.  PLAN:  The patient will be admitted and started on IV antibiotics presumptively.  We will proceed with a CT scan now.  If there is any question about  appendicitis, we will proceed with diagnostic laparoscopy later on today. DD:  09/21/00 TD:  09/21/00 Job: [id number]

## 2011-05-14 NOTE — Op Note (Signed)
NAME:  [name], [name]                          ACCOUNT NO.:  [id number]   MEDICAL RECORD NO.:  [id number]                   PATIENT TYPE:  AMB   LOCATION:  DSC                                  FACILITY:  [facility]   PHYSICIAN:  Katy Fitch. Naaman Plummer., M.D.          DATE OF BIRTH:  11/18/1946   DATE OF PROCEDURE:  08/13/2003  DATE OF DISCHARGE:                                 OPERATIVE REPORT   PREOPERATIVE DIAGNOSES:  Entrapment neuropathy of median nerve, left carpal  tunnel.   POSTOPERATIVE DIAGNOSES:  Entrapment neuropathy of median nerve, left carpal  tunnel.   OPERATION PERFORMED:  Release of left transverse carpal ligament.   SURGEON:  Katy Fitch. Sypher, M.D.   ASSISTANT:  Jonni Sanger, P.A.   ANESTHESIA:  General by mask.   SUPERVISING ANESTHESIOLOGIST:  Burna Forts, M.D.   INDICATIONS FOR PROCEDURE:  [name] is a 65 year old employee of the [employer] who has a history of bilateral hand discomfort and pain.  She was noted to have signs of entrapment neuropathy of the medial nerve  bilaterally.  Due to failure to respond to nonoperative measures, the  patient is brought to the operating room at this time for release of her  left transverse carpal ligament.   DESCRIPTION OF PROCEDURE:  [name] was brought to the operating room  and placed in supine position upon the operating table.  Following induction  of general anesthesia by mask, the left arm was prepped with Betadine soap  and solution and sterilely draped.  Following exsanguination of the limb  with an Esmarch bandage, an arterial tourniquet on the proximal brachium was  inflated to 220 mmHg.  The procedure commenced with a short incision in line  with the ring finger in the palm.  Subcutaneous tissues are carefully  divided revealing the palmar fascia.  This was split longitudinally to  reveal the common sensory branch of the median nerve.  This was followed  back to the transverse  carpal ligament which was carefully isolated from the  median nerve.  The ligament was released on its ulnar border extending to  the distal forearm.  This widely opened the carpal canal.  No masses or  other predicaments were noted.  Bleeding points along the margin of the  released ligament were electrocauterized with bipolar current followed by  repair of the skin with intradermal 3-0 Prolene suture.   A compressive dressing was applied with a volar plaster splint maintaining  the wrist in five degrees dorsiflexion.                                                Katy Fitch Naaman Plummer., M.D.    RVS/MEDQ  D:  08/13/2003  T:  08/13/2003  Job:  [id number]   cc:   Patrica Duel, M.D.  [address]  [place]  [place] [address]  Fax: [phone]

## 2011-05-14 NOTE — Discharge Summary (Signed)
[facility]  Patient:    [name], [name]                       MRN: [id number] Adm. Date:  [id number] Disc. Date: [id number] Attending:  Brandy Hale CC:         Eula Listen, M.D., [facility] [address]   Discharge Summary  FINAL DIAGNOSES:  Acute appendicitis.  OPERATIONS PERFORMED:  Laparoscopic appendectomy.  DATE OF SURGERY:  September 21, 2000  ADMISSION HISTORY:  This is a 65 year old white female with the onset of centralized abdominal pain on September 20, 2000.  The pain progressed, became more localized to the right lower quadrant.  She denied nausea, vomiting, fever, or chills.  She had some diarrhea the day of admission.  She also saw a little bit of blood in her stools.  She thinks that she has hemorrhoids.  PAST MEDICAL HISTORY:  She had laparoscopic cholecystectomy in 1995.  She has has had cataract surgery.  She has hypertension, stress incontinence.  She has had four pregnancies and four deliveries.  CURRENT MEDICATIONS:  None.  DRUG ALLERGIES:  None known.  ADMISSION PHYSICAL EXAMINATION:  VITAL SIGNS:  Temperature 96.7, blood pressure 165/100, heart rate 89.  GENERAL APPEARANCE:  Pleasant, somewhat overweight female in mild distress.  CHEST:  Lungs clear to auscultation.  CARDIOVASCULAR:  Regular rate and rhythm.  ABDOMEN:  Tender with involuntary guarding in the right lower quadrant. Well-healed laparoscopy scars.  RECTAL:  Exam revealed loose stool which was trace Hemoccult positive.  ADMISSION LABORATORY AND X-RAY  DATA:  White blood cell count 12,700, hemoglobin 14.1.  Urinalysis normal.  CT scan showed focal inflammatory changes very consistent with acute appendicitis.  No abscess.  No other abnormality.  HOSPITAL COURSE:  The patient was taken to the operating room and underwent diagnostic laparoscopy.  She was found to have acute suppurative appendicitis. No other abnormalities were  noted.  She underwent laparoscopic appendectomy.  Postoperatively, she progressed quite well.  She advanced her diet and activities over the next 36-48 hours without any complications.  She was discharged on September 28.  At that time she was afebrile, tolerating diet, voiding well, feeling better, and wanting to go home.  Her abdomen was soft and benign; the wounds looked good.  FOLLOWUP:  She was asked to follow up with me in the office in three to four weeks.  DISCHARGE MEDICATIONS:  She was given a prescription for Vicodin for pain. DD:  10/03/00 TD:  10/04/00 Job: [id number]

## 2011-05-14 NOTE — Discharge Summary (Signed)
[place]  Patient:    [name], [name]                       MRN: [id number] Adm. Date:  [id number] Disc. Date: [id number] Attending:  Berry, Jonathan [place] Dictator:   [facility].-C.                           Discharge Summary  NO DICTATION DD:  12/21/00 TD:  12/21/00 Job: [id number]

## 2011-09-28 LAB — BASIC METABOLIC PANEL
BUN: 12
CO2: 29
Calcium: 9.3
Chloride: 104
Creatinine, Ser: 0.94
GFR calc Af Amer: >60
GFR calc non Af Amer: >60
Glucose, Bld: 112 — ABNORMAL HIGH
Potassium: 4.2
Sodium: 140

## 2011-09-28 LAB — POCT HEMOGLOBIN-HEMACUE: Hemoglobin: 13.2

## 2012-01-25 ENCOUNTER — Other Ambulatory Visit: Payer: Self-pay | Admitting: Orthopedic Surgery

## 2012-01-27 ENCOUNTER — Encounter (HOSPITAL_BASED_OUTPATIENT_CLINIC_OR_DEPARTMENT_OTHER): Payer: Self-pay | Admitting: *Deleted

## 2012-01-27 ENCOUNTER — Encounter (HOSPITAL_BASED_OUTPATIENT_CLINIC_OR_DEPARTMENT_OTHER)
Admission: RE | Admit: 2012-01-27 | Discharge: 2012-01-27 | Disposition: A | Discharge status: Home / Self Care | Payer: Federal, State, Local not specified - PPO | Source: Ambulatory Visit | Attending: Orthopedic Surgery | Admitting: Orthopedic Surgery

## 2012-01-27 NOTE — Progress Notes (Signed)
Pt very slow response to questions-could not tell me much hx-did look up past notes-prompted pt-slow speech-denies alcohol or drugs Flat affect Had her take notes to come in for labs and ekg-denies any problems other than high bp

## 2012-02-01 ENCOUNTER — Ambulatory Visit (HOSPITAL_BASED_OUTPATIENT_CLINIC_OR_DEPARTMENT_OTHER): Payer: Federal, State, Local not specified - PPO | Admitting: Certified Registered Nurse Anesthetist

## 2012-02-01 ENCOUNTER — Encounter (HOSPITAL_BASED_OUTPATIENT_CLINIC_OR_DEPARTMENT_OTHER): Payer: Self-pay | Admitting: Certified Registered Nurse Anesthetist

## 2012-02-01 ENCOUNTER — Encounter (HOSPITAL_BASED_OUTPATIENT_CLINIC_OR_DEPARTMENT_OTHER): Payer: Self-pay | Admitting: *Deleted

## 2012-02-01 ENCOUNTER — Ambulatory Visit (HOSPITAL_BASED_OUTPATIENT_CLINIC_OR_DEPARTMENT_OTHER)
Admission: RE | Admit: 2012-02-01 | Discharge: 2012-02-01 | Disposition: A | Discharge status: Home / Self Care | Payer: Federal, State, Local not specified - PPO | Source: Ambulatory Visit | Attending: Orthopedic Surgery | Admitting: Orthopedic Surgery

## 2012-02-01 ENCOUNTER — Encounter (HOSPITAL_BASED_OUTPATIENT_CLINIC_OR_DEPARTMENT_OTHER)
Admission: RE | Disposition: A | Discharge status: Home / Self Care | Payer: Self-pay | Source: Ambulatory Visit | Attending: Orthopedic Surgery

## 2012-02-01 ENCOUNTER — Other Ambulatory Visit: Payer: Self-pay

## 2012-02-01 DIAGNOSIS — I10 Essential (primary) hypertension: Secondary | ICD-10-CM | POA: Insufficient documentation

## 2012-02-01 DIAGNOSIS — M19049 Primary osteoarthritis, unspecified hand: Secondary | ICD-10-CM | POA: Insufficient documentation

## 2012-02-01 DIAGNOSIS — M65849 Other synovitis and tenosynovitis, unspecified hand: Secondary | ICD-10-CM | POA: Insufficient documentation

## 2012-02-01 DIAGNOSIS — M65839 Other synovitis and tenosynovitis, unspecified forearm: Secondary | ICD-10-CM | POA: Insufficient documentation

## 2012-02-01 DIAGNOSIS — K219 Gastro-esophageal reflux disease without esophagitis: Secondary | ICD-10-CM | POA: Insufficient documentation

## 2012-02-01 HISTORY — PX: FINGER ARTHRODESIS: SHX5000

## 2012-02-01 HISTORY — DX: Unspecified osteoarthritis, unspecified site: M19.90

## 2012-02-01 HISTORY — DX: Gastro-esophageal reflux disease without esophagitis: K21.9

## 2012-02-01 HISTORY — DX: Essential (primary) hypertension: I10

## 2012-02-01 LAB — POCT I-STAT, CHEM 8
BUN: 20 mg/dL (ref 6–23)
Calcium, Ion: 1.13 mmol/L (ref 1.12–1.32)
Chloride: 109 mEq/L (ref 96–112)
Creatinine, Ser: 0.9 mg/dL (ref 0.50–1.10)
Glucose, Bld: 108 mg/dL — ABNORMAL HIGH (ref 70–99)
HCT: 44 % (ref 36.0–46.0)
Hemoglobin: 15 g/dL (ref 12.0–15.0)
Potassium: 4.3 mEq/L (ref 3.5–5.1)
Sodium: 142 mEq/L (ref 135–145)
TCO2: 25 mmol/L (ref 0–100)

## 2012-02-01 SURGERY — FUSION, JOINT, FINGER
Anesthesia: General | Site: Hand | Laterality: Right | Wound class: Clean

## 2012-02-01 MED ORDER — MEPERIDINE HCL 25 MG/ML IJ SOLN: 6.2500 mg | INTRAMUSCULAR | Status: DC | PRN

## 2012-02-01 MED ORDER — PROPOFOL 10 MG/ML IV EMUL
INTRAVENOUS | Status: DC | PRN
  Administered 2012-02-01: 200 mg via INTRAVENOUS

## 2012-02-01 MED ORDER — CHLORHEXIDINE GLUCONATE 4 % EX LIQD: 60.0000 mL | Freq: Once | CUTANEOUS | Status: DC

## 2012-02-01 MED ORDER — HYDROMORPHONE HCL 2 MG PO TABS: ORAL_TABLET | ORAL | Status: AC

## 2012-02-01 MED ORDER — FENTANYL CITRATE 0.05 MG/ML IJ SOLN
INTRAMUSCULAR | Status: DC | PRN
  Administered 2012-02-01: 50 ug via INTRAVENOUS

## 2012-02-01 MED ORDER — HYDROMORPHONE HCL PF 1 MG/ML IJ SOLN
0.2500 mg | INTRAMUSCULAR | Status: DC | PRN
  Administered 2012-02-01 (×3): 0.5 mg via INTRAVENOUS

## 2012-02-01 MED ORDER — CEFAZOLIN SODIUM 1-5 GM-% IV SOLN
1.0000 g | Freq: Once | INTRAVENOUS | Status: AC
  Administered 2012-02-01: 2 g via INTRAVENOUS

## 2012-02-01 MED ORDER — LIDOCAINE HCL (CARDIAC) 20 MG/ML IV SOLN
INTRAVENOUS | Status: DC | PRN
  Administered 2012-02-01: 60 mg via INTRAVENOUS

## 2012-02-01 MED ORDER — HYDROMORPHONE HCL 2 MG PO TABS
2.0000 mg | ORAL_TABLET | Freq: Once | ORAL | Status: AC
  Administered 2012-02-01: 2 mg via ORAL

## 2012-02-01 MED ORDER — LACTATED RINGERS IV SOLN
INTRAVENOUS | Status: DC
  Administered 2012-02-01 (×2): via INTRAVENOUS

## 2012-02-01 MED ORDER — LIDOCAINE HCL 2 % IJ SOLN
INTRAMUSCULAR | Status: DC | PRN
  Administered 2012-02-01: 4.5 mL

## 2012-02-01 MED ORDER — CEPHALEXIN 500 MG PO CAPS: 500.0000 mg | ORAL_CAPSULE | Freq: Three times a day (TID) | ORAL | Status: AC

## 2012-02-01 MED ORDER — ONDANSETRON HCL 4 MG/2ML IJ SOLN
INTRAMUSCULAR | Status: DC | PRN
  Administered 2012-02-01: 4 mg via INTRAVENOUS

## 2012-02-01 MED ORDER — DEXAMETHASONE SODIUM PHOSPHATE 10 MG/ML IJ SOLN
INTRAMUSCULAR | Status: DC | PRN
  Administered 2012-02-01: 10 mg via INTRAVENOUS

## 2012-02-01 MED ORDER — PROMETHAZINE HCL 25 MG/ML IJ SOLN: 6.2500 mg | INTRAMUSCULAR | Status: DC | PRN

## 2012-02-01 SURGICAL SUPPLY — 78 items
BANDAGE ADHESIVE 1X3 (GAUZE/BANDAGES/DRESSINGS) IMPLANT
BANDAGE CONFORM 3  STR LF (GAUZE/BANDAGES/DRESSINGS) IMPLANT
BANDAGE ELASTIC 3 VELCRO ST LF (GAUZE/BANDAGES/DRESSINGS) IMPLANT
BANDAGE GAUZE ELAST BULKY 4 IN (GAUZE/BANDAGES/DRESSINGS) IMPLANT
BIT DRILL MINI LNG ACUTRAK 2 (BIT) IMPLANT
BLADE MINI RND TIP GREEN BEAV (BLADE) IMPLANT
BLADE SURG 15 STRL LF DISP TIS (BLADE) ×1 IMPLANT
BLADE SURG 15 STRL SS (BLADE) ×2
BNDG CMPR 9X4 STRL LF SNTH (GAUZE/BANDAGES/DRESSINGS) ×1
BNDG CMPR MD 5X2 ELC HKLP STRL (GAUZE/BANDAGES/DRESSINGS) ×2
BNDG COHESIVE 1X5 TAN STRL LF (GAUZE/BANDAGES/DRESSINGS) IMPLANT
BNDG ELASTIC 2 VLCR STRL LF (GAUZE/BANDAGES/DRESSINGS) ×2 IMPLANT
BNDG ESMARK 4X9 LF (GAUZE/BANDAGES/DRESSINGS) ×2 IMPLANT
BRUSH SCRUB EZ PLAIN DRY (MISCELLANEOUS) ×2 IMPLANT
BUR EGG/OVAL CARBIDE (BURR) ×1 IMPLANT
BUR FAST CUTTING MED (BURR) IMPLANT
CLOTH BEACON ORANGE TIMEOUT ST (SAFETY) ×2 IMPLANT
CORDS BIPOLAR (ELECTRODE) ×2 IMPLANT
COVER MAYO STAND STRL (DRAPES) ×2 IMPLANT
COVER TABLE BACK 60X90 (DRAPES) ×2 IMPLANT
CUFF TOURNIQUET SINGLE 18IN (TOURNIQUET CUFF) ×1 IMPLANT
DECANTER SPIKE VIAL GLASS SM (MISCELLANEOUS) IMPLANT
DRAPE EXTREMITY T 121X128X90 (DRAPE) ×2 IMPLANT
DRAPE OEC MINIVIEW 54X84 (DRAPES) ×2 IMPLANT
DRAPE SURG 17X23 STRL (DRAPES) ×2 IMPLANT
DRILL MINI LNG ACUTRAK 2 (BIT) ×2
DRSG TEGADERM 4X4.75 (GAUZE/BANDAGES/DRESSINGS) IMPLANT
GAUZE XEROFORM 1X8 LF (GAUZE/BANDAGES/DRESSINGS) IMPLANT
GLOVE BIO SURGEON STRL SZ 6.5 (GLOVE) ×1 IMPLANT
GLOVE BIOGEL M STRL SZ7.5 (GLOVE) ×2 IMPLANT
GLOVE BIOGEL PI IND STRL 7.0 (GLOVE) IMPLANT
GLOVE BIOGEL PI IND STRL 8 (GLOVE) ×1 IMPLANT
GLOVE BIOGEL PI INDICATOR 7.0 (GLOVE) ×2
GLOVE BIOGEL PI INDICATOR 8 (GLOVE) ×1
GLOVE ECLIPSE 6.5 STRL STRAW (GLOVE) ×1 IMPLANT
GLOVE ORTHO TXT STRL SZ7.5 (GLOVE) ×2 IMPLANT
GOWN PREVENTION PLUS XLARGE (GOWN DISPOSABLE) ×3 IMPLANT
GOWN PREVENTION PLUS XXLARGE (GOWN DISPOSABLE) ×4 IMPLANT
K-WIRE 4.0X.028 (WIRE) IMPLANT
KWIRE 4.0 X .035IN (WIRE) ×2 IMPLANT
LOOP VESSEL MAXI BLUE (MISCELLANEOUS) IMPLANT
NDL HYPO 25X1 1.5 SAFETY (NEEDLE) IMPLANT
NEEDLE 27GAX1X1/2 (NEEDLE) ×1 IMPLANT
NEEDLE HYPO 25X1 1.5 SAFETY (NEEDLE) IMPLANT
NS IRRIG 1000ML POUR BTL (IV SOLUTION) ×2 IMPLANT
PACK BASIN DAY SURGERY FS (CUSTOM PROCEDURE TRAY) ×2 IMPLANT
PAD CAST 3X4 CTTN HI CHSV (CAST SUPPLIES) IMPLANT
PADDING CAST ABS 4INX4YD NS (CAST SUPPLIES) ×1
PADDING CAST ABS COTTON 4X4 ST (CAST SUPPLIES) ×1 IMPLANT
PADDING CAST COTTON 3X4 STRL (CAST SUPPLIES) ×2
PADDING UNDERCAST 2  STERILE (CAST SUPPLIES) ×2 IMPLANT
PADDING WEBRIL 3 STERILE (GAUZE/BANDAGES/DRESSINGS) IMPLANT
PLASTER SPLINT XTRA FAST 3X15 (CAST SUPPLIES) ×1 IMPLANT
SCREW ACUTRAK 2 MINI 28MM (Screw) ×1 IMPLANT
SLEEVE SCD COMPRESS KNEE MED (MISCELLANEOUS) ×1 IMPLANT
SPLINT PLASTER CAST XFAST 3X15 (CAST SUPPLIES) IMPLANT
SPLINT PLASTER XTRA FASTSET 3X (CAST SUPPLIES)
SPONGE GAUZE 4X4 12PLY (GAUZE/BANDAGES/DRESSINGS) ×2 IMPLANT
STOCKINETTE 4X48 STRL (DRAPES) ×2 IMPLANT
STRIP CLOSURE SKIN 1/2X4 (GAUZE/BANDAGES/DRESSINGS) ×1 IMPLANT
SUT ETHILON 5 0 P 3 18 (SUTURE)
SUT FIBERWIRE 3-0 18 TAPR NDL (SUTURE)
SUT MERSILENE 4 0 P 3 (SUTURE) ×1 IMPLANT
SUT NYLON ETHILON 5-0 P-3 1X18 (SUTURE) IMPLANT
SUT PROLENE 3 0 PS 2 (SUTURE) ×1 IMPLANT
SUT PROLENE 4 0 P 3 18 (SUTURE) IMPLANT
SUT STEEL 0 (SUTURE)
SUT STEEL 0 18XMFL TIE 17 (SUTURE) IMPLANT
SUT VIC AB 4-0 P-3 18XBRD (SUTURE) IMPLANT
SUT VIC AB 4-0 P3 18 (SUTURE)
SUTURE FIBERWR 3-0 18 TAPR NDL (SUTURE) IMPLANT
SYR 3ML 23GX1 SAFETY (SYRINGE) IMPLANT
SYR BULB 3OZ (MISCELLANEOUS) ×2 IMPLANT
SYR CONTROL 10ML LL (SYRINGE) ×2 IMPLANT
TOWEL OR 17X24 6PK STRL BLUE (TOWEL DISPOSABLE) ×4 IMPLANT
TRAY DSU PREP LF (CUSTOM PROCEDURE TRAY) ×2 IMPLANT
UNDERPAD 30X30 INCONTINENT (UNDERPADS AND DIAPERS) ×2 IMPLANT
WATER STERILE IRR 1000ML POUR (IV SOLUTION) ×1 IMPLANT

## 2012-02-01 NOTE — Anesthesia Procedure Notes (Signed)
Procedure Name: LMA Insertion Date/Time: 02/01/2012 12:19 PM Performed by: Rodney Wigger D Pre-anesthesia Checklist: Patient identified, Emergency Drugs available, Suction available and Patient being monitored Patient Re-evaluated:Patient Re-evaluated prior to inductionOxygen Delivery Method: Circle System Utilized Preoxygenation: Pre-oxygenation with 100% oxygen Intubation Type: IV induction Ventilation: Mask ventilation without difficulty LMA: LMA inserted LMA Size: 4.0 Number of attempts: 1 Placement Confirmation: positive ETCO2 Tube secured with: Tape Dental Injury: Teeth and Oropharynx as per pre-operative assessment

## 2012-02-01 NOTE — Anesthesia Preprocedure Evaluation (Signed)
Anesthesia Evaluation  Patient identified by MRN, date of birth, ID band Patient awake    Reviewed: Allergy & Precautions, H&P , NPO status , Patient's Chart, lab work & pertinent test results, reviewed documented beta blocker date and time   Airway Mallampati: III TM Distance: >3 FB Neck ROM: Full    Dental No notable dental hx. (+) Teeth Intact   Pulmonary neg pulmonary ROS,  clear to auscultation  Pulmonary exam normal       Cardiovascular hypertension, On Medications and On Home Beta Blockers Regular Normal    Neuro/Psych Negative Neurological ROS  Negative Psych ROS   GI/Hepatic negative GI ROS, Neg liver ROS, GERD-  Controlled,  Endo/Other  Negative Endocrine ROS  Renal/GU negative Renal ROS  Genitourinary negative   Musculoskeletal   Abdominal   Peds  Hematology negative hematology ROS (+)   Anesthesia Other Findings   Reproductive/Obstetrics negative OB ROS                           Anesthesia Physical Anesthesia Plan  ASA: II  Anesthesia Plan: General   Post-op Pain Management:    Induction: Intravenous  Airway Management Planned: LMA  Additional Equipment:   Intra-op Plan:   Post-operative Plan: Extubation in OR  Informed Consent: I have reviewed the patients History and Physical, chart, labs and discussed the procedure including the risks, benefits and alternatives for the proposed anesthesia with the patient or authorized representative who has indicated his/her understanding and acceptance.     Plan Discussed with: CRNA  Anesthesia Plan Comments:         Anesthesia Quick Evaluation

## 2012-02-01 NOTE — Op Note (Signed)
OP NOTE DICTATED:  02/01/12 161096

## 2012-02-01 NOTE — H&P (Addendum)
  [name] is an 66 y.o. female.   Chief Complaint: increasing pain in her right thumb IP joint HPI:  Patient is a 66 year old right-hand-dominant female who previously had undergone an arthrodesis of her left thumb IP joint in 2009 she did very well following that procedure and is very please with surgical outcome. He states that over the past year and a half she's had increasing pain and deformity of her right thumb IP joint. This is making it difficult for her to perform her ADLs. She wishes to proceed with surgical intervention of the right thumb IP joint at this time.  Past Medical History  Diagnosis Date  . Arthritis   . GERD (gastroesophageal reflux disease)   . Hypertension     Past Surgical History  Procedure Date  . Cholecystectomy     lap choli-2001  . Cardiac catheterization 2001    normal  . Tonsillectomy   . Eye surgery     both cataracts  . Thumb arthroscopy 2009    lt thumb graft  . Colonoscopy     No family history on file. Social History:  reports that she has never smoked. She does not have any smokeless tobacco history on file. She reports that she drinks alcohol. She reports that she does not use illicit drugs.  Allergies:  Allergies  Allergen Reactions  . Acetaminophen     REACTION: chest pain/pressure/palpitations    No current facility-administered medications on file as of .   Medications Prior to Admission  Medication Sig Dispense Refill  . bisoprolol-hydrochlorothiazide (ZIAC) 2.5-6.25 MG per tablet Take 1 tablet by mouth daily.        No results found for this or any previous visit (from the past 48 hour(s)).  No results found.   Pertinent items are noted in HPI.  Height 5\' 2"  (1.575 m), weight 80.287 kg (177 lb).  General appearance: alert Head: Normocephalic, without obvious abnormality Neck: supple, symmetrical, trachea midline Resp: clear to auscultation bilaterally Cardio: regular rate and rhythm, S1, S2 normal, no murmur,  click, rub or gallop GI: normal findings: bowel sounds normal Extremities: Physical exam reveals that she has significant radial deviation in the right thumb IP joint of at least 30 degrees. She has tenderness at the limits of motion. She has pain with flexion/extension. Her MP and CMC motions are preserved. IP motion this morning reproduces pain and reveals triggering at the A-1 pulley  Four views of her right thumb are reviewed. She has subluxation of her distal phalanx off the proximal phalanx and bone on bone arthropathy with large marginal osteophytes radially and dorsally.   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  Plan:She would like to proceed with arthrodesis of her right thumb IP joint. She is very pleased with the performance of her left thumb. The surgery, after care, risks and benefits were described in detail. Questions were invited and answered in detail. She will be scheduled at a mutually convenient time in the near future.  We will release the A-1 pulley of the right thumb based on our updated exam on 02/01/12.    DASNOIT,Valeria Krisko J 02/01/2012, 10:34 AM    H&P documentation: 02/01/2012  -History and Physical Reviewed  -Patient has been re-examined  -No change in the plan of care  Joanne Forster, MD

## 2012-02-01 NOTE — Transfer of Care (Signed)
Immediate Anesthesia Transfer of Care Note  Patient: [name]  Procedure(s) Performed:  ARTHRODESIS FINGER - right thumb fusion and right thumb release A1 pulley  Patient Location: PACU  Anesthesia Type: General  Level of Consciousness: sedated  Airway & Oxygen Therapy: Patient Spontanous Breathing and Patient connected to face mask oxygen  Post-op Assessment: Report given to PACU RN and Post -op Vital signs reviewed and stable  Post vital signs: Reviewed and stable  Complications: No apparent anesthesia complications

## 2012-02-01 NOTE — Brief Op Note (Signed)
02/01/2012  1:27 PM  PATIENT:  Loleta Chance  66 y.o. female  PRE-OPERATIVE DIAGNOSIS:  right thumb ip djd and stenosing teno synovitis right thumb  POST-OPERATIVE DIAGNOSIS:  right thumb ip djd and stenosing teno synovitis right thumb  PROCEDURE:  Procedure(s): ARTHRODESIS RIGHT THUMB INTERPHALANGEAL JOINT WITH ACUTRAK SCREW FIXATION, RELEASE A-1 PULLEY  SURGEON:  Surgeon(s): Wyn Forster., MD  PHYSICIAN ASSISTANT:   ASSISTANTS: Mallory Shirk.A-C   ANESTHESIA:   general  EBL:  Total I/O In: 1000 [I.V.:1000] Out: -   BLOOD ADMINISTERED:none  DRAINS: none   LOCAL MEDICATIONS USED:  XYLOCAINE 5 CC 2 %  SPECIMEN:  No Specimen  DISPOSITION OF SPECIMEN:  N/A  COUNTS:  YES  TOURNIQUET:   Total Tourniquet Time Documented: Upper Arm (Right) - 44 minutes  DICTATION: .Other Dictation: Dictation Number [id number]  PLAN OF CARE: Discharge to home after PACU  PATIENT DISPOSITION:  PACU - hemodynamically stable.

## 2012-02-01 NOTE — Anesthesia Postprocedure Evaluation (Signed)
  Anesthesia Post-op Note  Patient: [name]  Procedure(s) Performed:  ARTHRODESIS FINGER - right thumb fusion and right thumb release A1 pulley  Patient Location: PACU  Anesthesia Type: General  Level of Consciousness: awake  Airway and Oxygen Therapy: Patient Spontanous Breathing and Patient connected to face mask oxygen  Post-op Pain: mild  Post-op Assessment: Post-op Vital signs reviewed, Patient's Cardiovascular Status Stable, Respiratory Function Stable, Patent Airway and No signs of Nausea or vomiting  Post-op Vital Signs: Reviewed and stable  Complications: No apparent anesthesia complications

## 2012-02-02 NOTE — Op Note (Signed)
[name], [name]                ACCOUNT NO.:  [id number]  MEDICAL RECORD NO.:  [id number]  LOCATION:                                 FACILITY:  PHYSICIAN:  Katy Fitch. Kaitlynn Tramontana, M.D.      DATE OF BIRTH:  DATE OF PROCEDURE:  02/01/2012 DATE OF DISCHARGE:                              OPERATIVE REPORT   PREOPERATIVE DIAGNOSES: 1. Severe arthritis, right thumb interphalangeal joint with 30 degrees     angular deformity and large marginal osteophytes with bone-on-bone     arthropathy. 2. Chronic stenosing tenosynovitis, right thumb A1 pulley.  POSTOPERATIVE DIAGNOSES: 1. Severe arthritis, right thumb interphalangeal joint with 30 degrees     angular deformity and large marginal osteophytes with bone-on-bone     arthropathy. 2. Chronic stenosing tenosynovitis, right thumb A1 pulley.  OPERATION: 1. Release of right thumb A1 pulley. 2. Through separate incision is arthrodesis of right thumb     interphalangeal joint utilizing a 28-mm Mini Acutrak 2 screw.  OPERATING SURGEON:  Katy Fitch. Aragon Scarantino, MD  ASSISTANT:  Marveen Reeks Dasnoit, PA-C  ANESTHESIA:  General by LMA supplemented by a right thumb digital block with 2% lidocaine, 5 mL total volume.  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  I have been working with her since 2004 on various upper extremity difficulties.  She had a history of bilateral carpal tunnel syndrome previously cared for and had a prior left thumb interphalangeal joint arthrodesis.  Recently, she presented for evaluation of her foot by Dr. Ophelia Charter and questioned to Dr. Ophelia Charter whether or not she should proceed with a fusion to her right thumb at the IP joint.  Dr. Ophelia Charter sent her to Dr. Magnus Ivan, his partner, for consult regarding the thumb.  Dr. Magnus Ivan examined her and felt that she had very significant arthritis.  She reported that she was very pleased with her experience with left thumb fusion with  our office.  Therefore, Dr. Magnus Ivan referred her back for a followup consult and fusion of the right thumb IP joint.  During our examination, [name] was also noted that she had a painful stenosing tenosynovitis of her right thumb at the A1 pulley.  Therefore, we recommended proceeding with release of the A1 pulley and arthrodesis of the right thumb interphalangeal joint.  Surgery was scheduled at this time.  Preoperatively, she was reminded of the potential risks and benefits of surgery.  She was noted to be on Ziac for elevated blood pressure per her primary care physician in [place].  Questions were invited and answered in detail.  She noted a Tylenol intolerance.  PROCEDURE:  [name] was brought to room #2 of the [facility] and placed in supine position on the operating table.  Following an anesthesia consult by Dr. Sampson Goon, general anesthesia by LMA technique was recommended and accepted.  In room #2 under Dr. Jarrett Ables direct supervision, general anesthesia by LMA technique was induced followed by provision of 2 g of Ancef as an IV prophylactic antibiotic per weight protocol.  The right upper extremity was prepped with Betadine soap solution and sterilely draped.  A pneumatic tourniquet  was applied to the proximal right brachium.  Upon exsanguination of the right arm with Esmarch bandage, the arterial tourniquet was inflated to 220 mmHg.  Procedure commenced with a routine surgical time-out followed by a short transverse incision directly over the palpably thickened right thumb A1 pulley.  Subcutaneous tissues were carefully divided taking care to identify and gently retract the radial proper digital nerve.  The pulley was quite thickened.  This was split with scalpel and scissors followed by delivery of the flexor tendon.  The flexor tendon had a large nodule proximal to the pulley.  This wound was then repaired with intradermal 3-0 Prolene  suture.  Attention was then directed to the IP joint dorsal surface.  A lazy-S incision was fashioned exposing the extensor mechanism.  The extensor was released 4 mm proximal to the articular surface.  Marginal osteophytes at the base of the distal phalanx were removed from beneath the extensor mechanism with a microrongeur.  The collateral ligaments were released.  The joint was opened shotgun style and a cup and cone arthrodesis was accomplished removing large osteophytes on the radial condyle of the proximal phalanx and the radial condyle of the distal phalanx.  The joint was positioned at 10 degrees of flexion and approximately 5 degrees of pronation.  A 0.035-inch Kirschner wire was placed with retrograde technique.  The joint was secured followed by use of a Mini Acutrak reamer to ream to 26 mm followed by placement of a 28-mm Acutrak screw subsequently compressing the joint while placing the screw. Outstanding compression was achieved and pronation was built in by properly positioning the distal phalanx.  AP, lateral and C-arm images confirmed very satisfactory position of the joint and hardware.  Excellent fixation was achieved.  Attention was then directed to the extensor, which was debrided followed by repair with a series of mattress sutures of 4-0 Mersilene.  The skin was then repaired with a series of intradermal 3-0 Prolene sutures and Steri-Strips.  A 5 mL of 2% lidocaine was infiltrated at the metacarpal head level to obtain a postoperative digital block.  The wounds were then dressed with Steri-Strips, sterile gauze, sterile Webril, and a forearm based thumb spica splint.  For aftercare, [name] is provided prescriptions for Dilaudid 2 mg 1 or 2 tablets p.o. q.4-6 hours p.r.n. pain, 30 tablets without refill, also Keflex 500 mg 1 p.o. q.8 hours for 4 days as a prophylactic antibiotic.     Katy Fitch Ulla Mckiernan, M.D.     RVS/MEDQ  D:  02/01/2012  T:   02/02/2012  Job:  [id number]  cc:   Corrie Mckusick, M.D.

## 2012-02-03 ENCOUNTER — Encounter (HOSPITAL_BASED_OUTPATIENT_CLINIC_OR_DEPARTMENT_OTHER): Payer: Self-pay | Admitting: Orthopedic Surgery

## 2014-09-04 ENCOUNTER — Encounter: Payer: Self-pay | Admitting: Internal Medicine

## 2015-01-28 ENCOUNTER — Emergency Department (HOSPITAL_COMMUNITY)
Admission: EM | Admit: 2015-01-28 | Discharge: 2015-01-29 | Disposition: A | Discharge status: Home / Self Care | Payer: Medicare Other | Attending: Emergency Medicine | Admitting: Emergency Medicine

## 2015-01-28 ENCOUNTER — Encounter (HOSPITAL_COMMUNITY): Payer: Self-pay | Admitting: Emergency Medicine

## 2015-01-28 DIAGNOSIS — Z008 Encounter for other general examination: Secondary | ICD-10-CM | POA: Diagnosis present

## 2015-01-28 DIAGNOSIS — I10 Essential (primary) hypertension: Secondary | ICD-10-CM | POA: Diagnosis not present

## 2015-01-28 DIAGNOSIS — Z79899 Other long term (current) drug therapy: Secondary | ICD-10-CM | POA: Diagnosis not present

## 2015-01-28 DIAGNOSIS — Z9889 Other specified postprocedural states: Secondary | ICD-10-CM | POA: Insufficient documentation

## 2015-01-28 DIAGNOSIS — Z8739 Personal history of other diseases of the musculoskeletal system and connective tissue: Secondary | ICD-10-CM | POA: Diagnosis not present

## 2015-01-28 DIAGNOSIS — G309 Alzheimer's disease, unspecified: Secondary | ICD-10-CM | POA: Diagnosis not present

## 2015-01-28 DIAGNOSIS — Z8719 Personal history of other diseases of the digestive system: Secondary | ICD-10-CM | POA: Diagnosis not present

## 2015-01-28 DIAGNOSIS — F0281 Dementia in other diseases classified elsewhere with behavioral disturbance: Secondary | ICD-10-CM | POA: Insufficient documentation

## 2015-01-28 DIAGNOSIS — F0391 Unspecified dementia with behavioral disturbance: Secondary | ICD-10-CM

## 2015-01-28 DIAGNOSIS — F03918 Unspecified dementia, unspecified severity, with other behavioral disturbance: Secondary | ICD-10-CM

## 2015-01-28 DIAGNOSIS — Z9183 Wandering in diseases classified elsewhere: Secondary | ICD-10-CM | POA: Diagnosis not present

## 2015-01-28 HISTORY — DX: Dementia in other diseases classified elsewhere, unspecified severity, without behavioral disturbance, psychotic disturbance, mood disturbance, and anxiety: F02.80

## 2015-01-28 HISTORY — DX: Alzheimer's disease, unspecified: G30.9

## 2015-01-28 LAB — COMPREHENSIVE METABOLIC PANEL
ALT: 34 U/L (ref 0–35)
AST: 30 U/L (ref 0–37)
Albumin: 4 g/dL (ref 3.5–5.2)
Alkaline Phosphatase: 104 U/L (ref 39–117)
Anion gap: 10 (ref 5–15)
BUN: 12 mg/dL (ref 6–23)
CO2: 27 mmol/L (ref 19–32)
Calcium: 9.1 mg/dL (ref 8.4–10.5)
Chloride: 102 mmol/L (ref 96–112)
Creatinine, Ser: 1.03 mg/dL (ref 0.50–1.10)
GFR calc Af Amer: 64 mL/min — ABNORMAL LOW (ref >90)
GFR calc non Af Amer: 55 mL/min — ABNORMAL LOW (ref >90)
Glucose, Bld: 149 mg/dL — ABNORMAL HIGH (ref 70–99)
Potassium: 3.7 mmol/L (ref 3.5–5.1)
Sodium: 139 mmol/L (ref 135–145)
Total Bilirubin: 0.4 mg/dL (ref 0.3–1.2)
Total Protein: 7.2 g/dL (ref 6.0–8.3)

## 2015-01-28 LAB — CBC WITH DIFFERENTIAL/PLATELET
Basophils Absolute: 0 10*3/uL (ref 0.0–0.1)
Basophils Relative: 0 % (ref 0–1)
Eosinophils Absolute: 0.1 10*3/uL (ref 0.0–0.7)
Eosinophils Relative: 2 % (ref 0–5)
HCT: 44.9 % (ref 36.0–46.0)
Hemoglobin: 14.3 g/dL (ref 12.0–15.0)
Lymphocytes Relative: 32 % (ref 12–46)
Lymphs Abs: 2.5 10*3/uL (ref 0.7–4.0)
MCH: 29.6 pg (ref 26.0–34.0)
MCHC: 31.8 g/dL (ref 30.0–36.0)
MCV: 93 fL (ref 78.0–100.0)
Monocytes Absolute: 0.7 10*3/uL (ref 0.1–1.0)
Monocytes Relative: 8 % (ref 3–12)
Neutro Abs: 4.6 10*3/uL (ref 1.7–7.7)
Neutrophils Relative %: 58 % (ref 43–77)
Platelets: 200 10*3/uL (ref 150–400)
RBC: 4.83 MIL/uL (ref 3.87–5.11)
RDW: 12.6 % (ref 11.5–15.5)
WBC: 7.9 10*3/uL (ref 4.0–10.5)

## 2015-01-28 LAB — URINALYSIS, ROUTINE W REFLEX MICROSCOPIC
Bilirubin Urine: NEGATIVE
Glucose, UA: NEGATIVE mg/dL
Hgb urine dipstick: NEGATIVE
Ketones, ur: NEGATIVE mg/dL
Leukocytes, UA: NEGATIVE
Nitrite: NEGATIVE
Protein, ur: NEGATIVE mg/dL
Specific Gravity, Urine: 1.025 (ref 1.005–1.030)
Urobilinogen, UA: 0.2 mg/dL (ref 0.0–1.0)
pH: 6 (ref 5.0–8.0)

## 2015-01-28 MED ORDER — BISOPROLOL-HYDROCHLOROTHIAZIDE 2.5-6.25 MG PO TABS
ORAL_TABLET | ORAL | Status: AC
  Filled 2015-01-28: qty 1

## 2015-01-28 MED ORDER — BISOPROLOL-HYDROCHLOROTHIAZIDE 2.5-6.25 MG PO TABS
1.0000 | ORAL_TABLET | Freq: Every day | ORAL | Status: DC
  Administered 2015-01-28 – 2015-01-29 (×2): 1 via ORAL
  Filled 2015-01-28 (×4): qty 1
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
## 2015-01-28 NOTE — ED Notes (Signed)
Patient placed in paper scrubs with assistance by daughter.

## 2015-01-28 NOTE — ED Provider Notes (Signed)
10:00 PM  Spoke with Cyndie MullAnna Andrews with [facility].  She reports the patient does not meet incurvation criteria. Would like us to place a social work consult for the AM for placement for the patient.  Layla MawKristen N Grabiela Wohlford, DO 01/28/15 2203

## 2015-01-28 NOTE — ED Notes (Signed)
PT's family reports pt has early onset Alzheimers and her caretaker(husband) is in the hospital so the pt hasn't been taking her medication correctly. PT family also reports pt has had visual hallucinations and combative with caretakers x3 days.
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
## 2015-01-28 NOTE — BHH Counselor (Signed)
TTS Counselor spoke with pt's attending RN and asked if tele-assessment cart could be placed in pt's room.  Counselor also reviewed pt's chart and MD note in preparation for behavioral health assessment.  Assessment to begin shortly.  Cyndie MullAnna Tashari Schoenfelder, [facility] Triage Specialist

## 2015-01-28 NOTE — BHH Counselor (Signed)
Per Donell SievertSpencer Simon, NP, pt does not meet criteria for inpt treatment. Social Work Consult is recommended to assist with appropriate placement.  TTS Counselor spoke with EDP, Dr. Elesa MassedWard, and informed of disposition. Dr. Elesa MassedWard states she will initiate social work consult for the AM.    Cyndie MullAnna Sherica Paternostro, [facility] Triage Specialist

## 2015-01-29 DIAGNOSIS — G309 Alzheimer's disease, unspecified: Secondary | ICD-10-CM | POA: Diagnosis not present

## 2015-01-29 MED ORDER — TUBERCULIN PPD 5 UNIT/0.1ML ID SOLN
5.0000 [IU] | Freq: Once | INTRADERMAL | Status: DC
  Administered 2015-01-29: 5 [IU] via INTRADERMAL
  Filled 2015-01-29: qty 0.1

## 2015-01-29 NOTE — ED Notes (Signed)
Patient ambulatory to restroom  ?

## 2015-01-29 NOTE — ED Notes (Signed)
Patient resting in bed, in a position of stated comfort.

## 2015-01-29 NOTE — ED Notes (Addendum)
Social Work reports pt was cleared inpt psych and reports pt is to be discharged home with family. Social work provided placement resources to pt family. EDP aware and give verbal order to rescind IVC paperwork and proceed with d/c.

## 2015-01-29 NOTE — Discharge Instructions (Signed)
°Emergency Department Resource Guide °1) Find a Doctor and Pay Out of Pocket °Although you won't have to find out who is covered by your insurance plan, it is a good idea to ask around and get recommendations. You will then need to call the office and see if the doctor you have chosen will accept you as a new patient and what types of options they offer for patients who are self-pay. Some doctors offer discounts or will set up payment plans for their patients who do not have insurance, but you will need to ask so you aren't surprised when you get to your appointment. ° °2) Contact Your Local Health Department °Not all health departments have doctors that can see patients for sick visits, but many do, so it is worth a call to see if yours does. If you don't know where your local health department is, you can check in your phone book. The CDC also has a tool to help you locate your state's health department, and many state websites also have listings of all of their local health departments. ° °3) Find a Walk-in Clinic °If your illness is not likely to be very severe or complicated, you may want to try a walk in clinic. These are popping up all over the country in pharmacies, drugstores, and shopping centers. They're usually staffed by nurse practitioners or physician assistants that have been trained to treat common illnesses and complaints. They're usually fairly quick and inexpensive. However, if you have serious medical issues or chronic medical problems, these are probably not your best option. ° °No Primary Care Doctor: °- Call Health Connect at  832-8000 - they can help you locate a primary care doctor that  accepts your insurance, provides certain services, etc. °- Physician Referral Service- 1-800-533-3463 ° °Chronic Pain Problems: °Organization         Address  Phone   Notes  °[facility]  (336) 297-2271 Patients need to be referred by their primary care doctor.  ° °Medication  Assistance: °Organization         Address  Phone   Notes  °[place] [facility] [address] °[place], [place] [address] (336) 641-8030 --Must be a resident of [place] °-- Must have NO insurance coverage whatsoever (no Medicaid/ Medicare, etc.) °-- The pt. MUST have a primary care doctor that directs their care regularly and follows them in the community °  °[facility]  (866) 331-1348   °[employer]  (888) 892-1162   ° °Agencies that provide inexpensive medical care: °Organization         Address  Phone   Notes  °[facility]  (336) 832-8035   °[facility]    (336) 832-7272   °[facility] [address] °[place], [place] [address] (336) 832-4777   °[facility] [place] [facility] [address], °[place] (336) 271-4999   °[facility]    (336) 373-0678   °[facility]    (336) 272-1050   °[facility] ° [address], [place] Phone:  (336) 832-4444, Fax:  (336) 832-4440 Hours of Operation:  9 am - 6 pm, M-F.  Also accepts Medicaid/Medicare and self-pay.  °[facility] ° [address], [place] Phone: (336) 832-3150, Fax: (336) 832-3151. Hours of Operation:  8:30 am - 5:30 pm, M-F.  Also accepts Medicaid and self-pay.  °[facility] [place] [address], [place] Phone: (336) 878-6027   °[facility] [address], [place], [place] (336)723-1848, Ext. 123 Mondays & Thursdays: 7-9 AM.  First 15 patients are seen on a first come, first serve basis. °  ° °Medicaid-accepting [place] Providers: ° °Organization         Address  Phone   Notes  °[facility] [address], [place] (336) 641-2100 Also accepts self-pay patients.  °[facility] [address], [place] ° (336) 856-9996   °[facility] [address], [place]  (336) 288-8857   °[facility] [address] [place] [address], [place] (336) 299-7000   °Veita Bland [address], [place]  ° (336) 373-1557 Only accepts [place] Access Medicaid patients after they have their name applied to their card.  ° °Self-Pay (no insurance) in [place]: ° °Organization         Address  Phone   Notes  °Sickle Cell Patients, [facility] [address], [place] (336) 832-1970   °[facility] [address], [place] (336) 832-4400   °[facility] ° [address], [place] (336) 992-4800   °[facility]/Dr. Osei-Bonsu ° [address] [place] [address], [place] or [address], [place] (336) 841-8500 Phone number for both [place] and [place] locations is the same.  °[facility] [address], [place] (336) 299-0000   °[facility] [address] [place] [address], [place] or [address] (336) 852-7530 °(336) 878-2260   °[facility] [address], [place] (336) 350-1642, phone; (336) 294-5005, fax Sees patients 1st and 3rd Saturday of every month.  Must not qualify for public or private insurance (i.e. Medicaid, Medicare, [place] Health Choice, Veterans' Benefits) • Household income should be no more than 200% of the poverty level •The clinic cannot treat you if you are pregnant or think you are pregnant • Sexually transmitted diseases are not treated at the clinic.  ° ° °Dental Care: °Organization         Address  Phone  Notes  °[place] [facility] [address], [place] (336) 641-6152 Accepts children up to age 21 who are enrolled in Medicaid or [place] Health Choice; pregnant women with a Medicaid card; and children who have applied for Medicaid or [place] Health Choice, but were declined, whose parents can pay a reduced fee at time of service.  °[place]  [facility] [place]  [address], [place] (336) 641-7733 Accepts children up to age 21 who are enrolled in Medicaid or [place] Health Choice; pregnant women with a Medicaid card; and children who have applied for Medicaid or [place] Health Choice, but were declined, whose parents can pay a reduced fee at time of service.  °[facility] ° [address],  (336) 641-4533 Patients are seen by appointment only. Walk-ins are not accepted. [facility] will see patients 18 years of age and older. °Monday - Tuesday (8am-5pm) °Most Wednesdays (8:30-5pm) °$30 per visit, cash only  °[facility] ° [address], [place] (336) 641-4533 Patients are seen by appointment only. Walk-ins are not accepted. [facility] will see patients 18 years of age and older. °One   Wednesday Evening (Monthly: Volunteer Based).  $30 per visit, cash only  °[facility]  (919) 537-3737 for adults; Children under age 4, call Graduate Pediatric Dentistry at (919) [phone]. Children aged 4-14, please call (919) 537-3737 to request a pediatric application. ° Dental services are provided in all areas of dental care including fillings, crowns and bridges, complete and partial dentures, implants, gum treatment, root canals, and extractions. Preventive care is also provided. Treatment is provided to both adults and children. °Patients are selected via a lottery and there is often a waiting list. °  °[facility] [address], °[place] ° (336) 763-8833 [email or web address] °  °[facility] [address], [place], [place] (336)723-1848, Ext. 123 Second and Fourth Thursday of each month, opens at 6:30 AM; Clinic ends at 9 AM.  Patients are seen on a first-come first-served basis, and a limited number are seen during each clinic.  ° °[facility] ° [address], [place], [place] (336) 723-7904    Eligibility Requirements °You must have lived in [place], [place], or [place] counties for at least the last three months. °  You cannot be eligible for state or federal sponsored healthcare insurance, including Veterans Administration, Medicaid, or Medicare. °  You generally cannot be eligible for healthcare insurance through your employer.  °  How to apply: °Eligibility screenings are held every Tuesday and Wednesday afternoon from 1:00 pm until 4:00 pm. You do not need an appointment for the interview!  °[facility] [address], [place], [place] 336-631-2330   °[place] [facility]  336-342-8273   °[place]  336-703-3100   °[facility]  336-570-6415   ° °Behavioral Health Resources in the Community: °Intensive Outpatient Programs °Organization         Address  Phone  Notes  °[facility] [address], [place], [place] 336-878-6098   °[facility] [address], [place], [place] 336-832-9800   °[facility] [address], [place], [place] ° 336-882-2125   °[facility] [address],  °[place], [place] 1-800-853-5163 or 336-641-4981   °Substance Abuse Resources °Organization         Address  Phone  Notes  °[facility]  336-882-2125   °[facility]  336-784-9470   °[facility]  336-285-9073   °[facility]  336-845-3988   °Residential & Outpatient Substance Abuse Program  1-800-659-3381   °Psychological Services °Organization         Address  Phone  Notes  °[facility]  336- 832-9600   °[facility]  336- 378-7881   °[facility] [address], [place] 1-800-853-5163 or 336-641-4981   ° °Mobile Crisis Teams °Organization         Address  Phone  Notes  °[facility], Mobile Crisis Care Unit  1-877-626-1772   °Assertive °[facility] ° [address]  [place], [place] 336-834-9664   °Sharon DeEsch [address] °[place] 336-554-5454   ° °Self-Help/Support Groups °Organization         Address  Phone             Notes  °[facility] [place] - variety of support groups  336- 373-1402 Call for more information  °Narcotics Anonymous (NA), [facility] [address], °[place]  2 meetings at this location  ° °  Residential Treatment Programs °Organization         Address  Phone  Notes  °[facility] [address],    °[place]  1-866-801-8205   °[facility] ° [address], [place], [place] 704-293-8524   °[facility] [address], [place] 336-845-3988 Admissions: 8am-3pm M-F  °[facility] [address]    °[place], [place] 336-841-1104   °[facility] [address], [place], [place] 336-379-7146   °[facility] [address]  °[place], [place] 336-285-9073   °[facility] Intensive Outpatient [address], [place], [place] 336-852-3033   °[facility] [address]  °[place], [place] 1-877-615-2722 or 336-784-9470   °[facility] [address] [place], [place] 336-227-7417 Accepts Medicaid  °[facility] [address]  °[place] 1-800-659-3381 Substance Abuse/Addiction Treatment  ° °[place] Behavioral Health Resources °Organization         Address  Phone  Notes  °[facility]  (888) 581-9988   °Julie Brannon, PhD [address] [place], [place]   (336) 349-5553 or (336) 951-0000   °[facility]   [address] °[place], [place] (336) 349-4454   °[facility] [address], [place], [place] (336) 342-8316 Insurance/Medicaid/sponsorship through [facility]  °[facility] [address]                                    [place], [place] (336) 342-8316 Therapy/tele-psych/case    °[facility] [address]  ° [place], [place] (336) 349-2233    °Dr. Arfeen  (336) 349-4544   °[facility] [place]  [employer] [place] [facility] 1) [address], [place] °2) [address], [place] °3)  [address], [place] (336) 349-3220 °(336) 342-7768 ° °(336) 342-8140   °[place] Child Abuse [facility] (336) 342-1394 or (336) 342-3537 (After Hours)    ° ° °

## 2015-01-29 NOTE — ED Notes (Signed)
Social Worker at bedside.

## 2015-01-29 NOTE — ED Notes (Signed)
Rescinded paperwork completed,notorized. D/c instructions reviewed with pt family. Social Worker coordinating nursing home assessment of pt at this time.

## 2015-01-29 NOTE — ED Provider Notes (Signed)
Patient care assumed at shift change. She has been evaluated by TTS and social work and both feel as though she is appropriate for discharge. I have spoken with the patient who expresses no homicidal or suicidal ideation and otherwise appears appropriate. She is comfortable with returning home.  Geoffery Lyonsouglas Joanne Swartzentruber, MD 01/29/15 1102

## 2015-01-29 NOTE — ED Notes (Addendum)
10am Ziac dose not loaded into pyxis. Pharmacy aware and [facility]. Pharmacy reported would bring dose down as soon as it was received.

## 2015-01-29 NOTE — ED Notes (Addendum)
Pt becoming increasingly anxious. Pt reports," I want my clothes I'm ready to go home." pt given a magazine to look through since pt tv not working. nad noted. Sitter remains at bedside.

## 2015-01-29 NOTE — ED Notes (Signed)
Pt daughter out in waiting room. Pt daughter aware of visiting hours and policy. Social Work aware that pt daughter is here.

## 2015-01-29 NOTE — ED Notes (Signed)
Patients belongings removed and secured in locker. Patients daughter Georgette ShellRebecca Smith has patients watch, bracelet and ring.    Patients daughter Georgette ShellRebecca Smith [phone]

## 2015-01-29 NOTE — ED Notes (Signed)
EDP given rescention paperwork. EDP reported would assess pt and process discharge.

## 2015-06-23 ENCOUNTER — Other Ambulatory Visit: Payer: Self-pay

## 2021-02-20 ENCOUNTER — Emergency Department (HOSPITAL_COMMUNITY): Payer: Medicare Other

## 2021-02-20 ENCOUNTER — Encounter (HOSPITAL_COMMUNITY): Payer: Self-pay

## 2021-02-20 ENCOUNTER — Emergency Department (HOSPITAL_COMMUNITY)
Admission: EM | Admit: 2021-02-20 | Discharge: 2021-02-20 | Disposition: A | Discharge status: Home / Self Care | Payer: Medicare Other | Attending: Emergency Medicine | Admitting: Emergency Medicine

## 2021-02-20 ENCOUNTER — Other Ambulatory Visit: Payer: Self-pay

## 2021-02-20 DIAGNOSIS — R569 Unspecified convulsions: Secondary | ICD-10-CM

## 2021-02-20 DIAGNOSIS — I10 Essential (primary) hypertension: Secondary | ICD-10-CM | POA: Diagnosis not present

## 2021-02-20 DIAGNOSIS — G40909 Epilepsy, unspecified, not intractable, without status epilepticus: Secondary | ICD-10-CM | POA: Insufficient documentation

## 2021-02-20 DIAGNOSIS — F0390 Unspecified dementia without behavioral disturbance: Secondary | ICD-10-CM | POA: Diagnosis not present

## 2021-02-20 HISTORY — DX: Unspecified convulsions: R56.9

## 2021-02-20 LAB — CBC
HCT: 38.3 % (ref 36.0–46.0)
Hemoglobin: 12 g/dL (ref 12.0–15.0)
MCH: 30.1 pg (ref 26.0–34.0)
MCHC: 31.3 g/dL (ref 30.0–36.0)
MCV: 96 fL (ref 80.0–100.0)
Platelets: 159 10*3/uL (ref 150–400)
RBC: 3.99 MIL/uL (ref 3.87–5.11)
RDW: 12.6 % (ref 11.5–15.5)
WBC: 5.7 10*3/uL (ref 4.0–10.5)
nRBC: 0 % (ref 0.0–0.2)

## 2021-02-20 LAB — BASIC METABOLIC PANEL
Anion gap: 15 (ref 5–15)
BUN: 28 mg/dL — ABNORMAL HIGH (ref 8–23)
CO2: 20 mmol/L — ABNORMAL LOW (ref 22–32)
Calcium: 9.4 mg/dL (ref 8.9–10.3)
Chloride: 104 mmol/L (ref 98–111)
Creatinine, Ser: 1.86 mg/dL — ABNORMAL HIGH (ref 0.44–1.00)
GFR, Estimated: 28 mL/min — ABNORMAL LOW (ref >60)
Glucose, Bld: 102 mg/dL — ABNORMAL HIGH (ref 70–99)
Potassium: 3.8 mmol/L (ref 3.5–5.1)
Sodium: 139 mmol/L (ref 135–145)

## 2021-02-20 MED ORDER — SODIUM CHLORIDE 0.9 % IV BOLUS
500.0000 mL | Freq: Once | INTRAVENOUS | Status: AC
  Administered 2021-02-20: 500 mL via INTRAVENOUS

## 2021-02-20 NOTE — ED Notes (Signed)
Patient transported to CT 

## 2021-02-20 NOTE — Discharge Instructions (Signed)
You were seen in the emergency department today with seizure-like activity.  I have listed the name of a neurologist in the community.  Please call on Monday to schedule a follow-up appointment.  Return to the emergency department or call 911 within the new or suddenly worsening symptoms.

## 2021-02-20 NOTE — ED Triage Notes (Addendum)
Pt is non verbal. Pt brought in by EMS. Pt has hx of seizures. Had grand mal seizures in front of husband lasting approx 5 mins. When EMS arrived pt was laying on sofa. Husband reports pts body stiffened and she started shaking all over

## 2021-02-20 NOTE — ED Provider Notes (Signed)
Emergency Department Provider Note   I have reviewed the triage vital signs and the nursing notes.   HISTORY  Chief Complaint Seizures   HPI [name] is a 75 y.o. female with PMH of Alzheimer's dementia, HTN, GERD, currently on Hospice, presents to the emergency department with her husband with seizure-like activity.  The patient's husband states that he cares for her at home with severe dementia.  She is nonverbal at baseline.  She has been acting like her normal self today but had a prolonged episode of seizure-like activity which concerned him.  The husband states that she has never been formally diagnosed with seizure.  She has 3-4 episodes of these seizure-like episodes per day.  He describes it as her going very stiff but remains alert and making eye contact.  The episode today lasted for approximately 5 minutes and then resolved.  The patient has returned to her mental status baseline.  He notes that the patient is under hospice care after she has stopped eating.  He states when the episode went on for longer than normal he did not know what to do and so called 911.    Level 5 caveat: Dementia   Past Medical History:  Diagnosis Date  . Alzheimer's dementia (HCC)   . Arthritis   . GERD (gastroesophageal reflux disease)   . Hypertension   . Seizures [facility])     Patient Active Problem List   Diagnosis Date Noted  . HYPERTENSION 09/19/2009  . HEMORRHOIDS 09/19/2009  . GERD 09/19/2009  . DYSPHAGIA UNSPECIFIED 09/19/2009  . EPIGASTRIC PAIN 09/19/2009  . Personal history of other diseases of digestive system 09/19/2009    Past Surgical History:  Procedure Laterality Date  . CARDIAC CATHETERIZATION  2001   normal  . CHOLECYSTECTOMY     lap choli-2001  . COLONOSCOPY    . EYE SURGERY     both cataracts  . FINGER ARTHRODESIS  02/01/2012   Procedure: ARTHRODESIS FINGER;  Surgeon: Wyn Forster., MD;  Location: [facility];  Service: Orthopedics;   Laterality: Right;  right thumb fusion and right thumb release A1 pulley  . THUMB ARTHROSCOPY  2009   lt thumb graft  . TONSILLECTOMY      Allergies Acetaminophen  No family history on file.  Social History Social History   Tobacco Use  . Smoking status: Never Smoker  Substance Use Topics  . Alcohol use: No  . Drug use: No    Review of Systems  Level 5 caveat: Dementia - non-verbal  ____________________________________________   PHYSICAL EXAM:  VITAL SIGNS: ED Triage Vitals [02/20/21 1452]  Enc Vitals Group     BP 102/73     Pulse Rate 63     Resp (!) 23     Temp (!) 97.2 F (36.2 C)     Temp Source Axillary     SpO2 98 %   Constitutional: Alert. Looking and me and husband when addressed. Well appearing and in no acute distress. Nonverbal at baseline.  Eyes: Conjunctivae are normal.  Head: Atraumatic. Nose: No congestion/rhinnorhea. Mouth/Throat: Mucous membranes are moist.  Neck: No stridor.  Cardiovascular: Normal rate, regular rhythm. Good peripheral circulation. Grossly normal heart sounds.   Respiratory: Normal respiratory effort.  No retractions. Lungs CTAB. Gastrointestinal: Soft and nontender. No distention.  Musculoskeletal: No gross deformities of extremities. Neurologic: No gross focal neurologic deficits are appreciated.  Skin:  Skin is warm, dry and intact. No rash noted.  ____________________________________________   LABS (all labs ordered are listed, but only abnormal results are displayed)  Labs Reviewed  BASIC METABOLIC PANEL - Abnormal; Notable for the following components:      Result Value   CO2 20 (*)    Glucose, Bld 102 (*)    BUN 28 (*)    Creatinine, Ser 1.86 (*)    GFR, Estimated 28 (*)    All other components within normal limits  CBC   ____________________________________________  EKG   EKG Interpretation  Date/Time:  Friday February 20 2021 15:28:29 EST Ventricular Rate:  53 PR Interval:    QRS  Duration: 138 QT Interval:  466 QTC Calculation: 438 R Axis:   69 Text Interpretation: Sinus rhythm Borderline short PR interval Nonspecific intraventricular conduction delay Confirmed by Alona Bene [phone]) on 02/20/2021 4:16:40 PM       ____________________________________________  RADIOLOGY  CT head reviewed.  ____________________________________________   PROCEDURES  Procedure(s) performed:   Procedures  None ____________________________________________   INITIAL IMPRESSION / ASSESSMENT AND PLAN / ED COURSE  Pertinent labs & imaging results that were available during my care of the patient were reviewed by me and considered in my medical decision making (see chart for details).   Patient presents to the emergency department for evaluation of seizure-like activity.  This has been ongoing for the past several months, multiple times per day, with today being the longest episode.  Patient is under hospice care as an outpatient and husband has not sought neurology follow-up primarily due to the patient's hospice status.  He is agreeable with checking basic labs and performing CT imaging of the head.  Patient is back to her baseline per husband at bedside.   Age-related findings on CT head. No other acute findings. Labs are reassuring. Plan for outpatient Neurology follow up. Patient does not drive. Discussed with husband who is comfortable with the plan at discharge.  ____________________________________________  FINAL CLINICAL IMPRESSION(S) / ED DIAGNOSES  Final diagnoses:  Seizure-like activity (HCC)     MEDICATIONS GIVEN DURING THIS VISIT:  Medications  sodium chloride 0.9 % bolus 500 mL (0 mLs Intravenous Stopped 02/20/21 1809)     Note:  This document was prepared using Dragon voice recognition software and may include unintentional dictation errors.  Alona Bene, MD, [facility] Emergency Medicine    Long, Arlyss Repress, MD 02/23/21 1020

## 2021-04-26 DEATH — deceased

## 2021-08-29 IMAGING — CT CT HEAD W/O CM
3 series · 16 of 47 positions shown, 19 images · non-contrast
Comparison: CT brain 07/25/2008

CLINICAL DATA: Seizure

EXAM:
CT HEAD WITHOUT CONTRAST
TECHNIQUE: Contiguous axial images were obtained from the base of the skull
through the vertex without intravenous contrast.

[Series 2: head w o · axial · 0.43mm/px · z∈[+44,+169]mm · 10 of 31 slices shown, 13 images]
[im 3/31  brain]
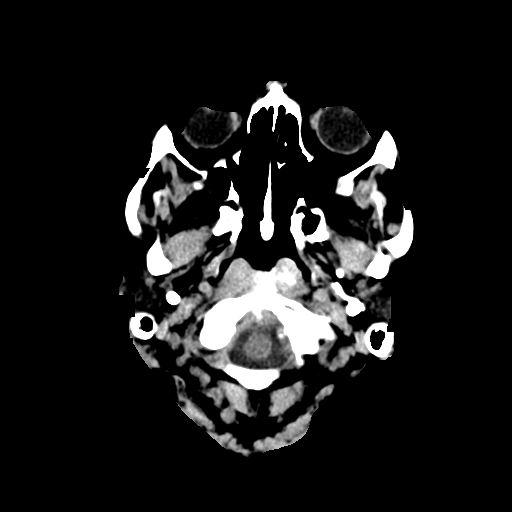
[im 3/31  bone]
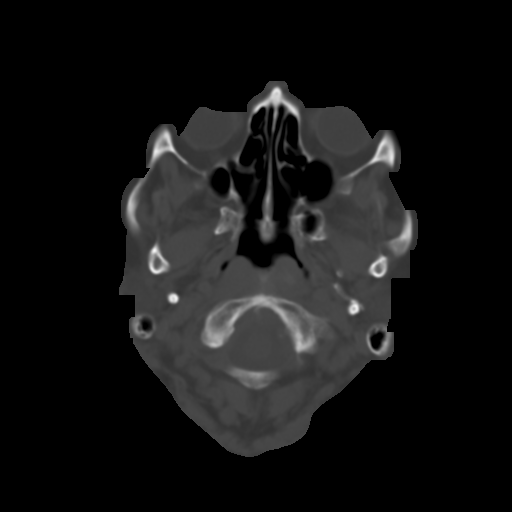
[im 6/31  brain]
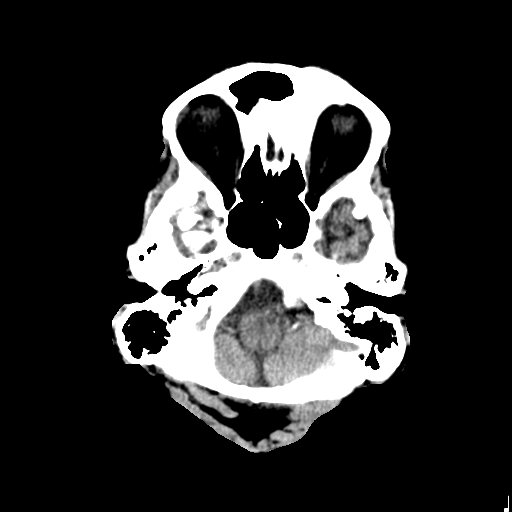
[im 9/31  brain]
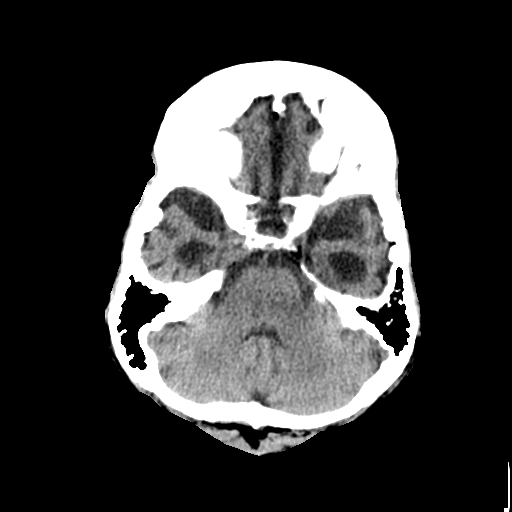
[im 11/31  brain]
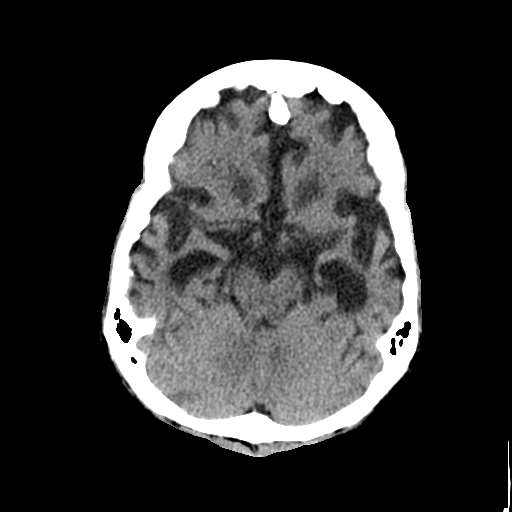
[im 14/31  brain]
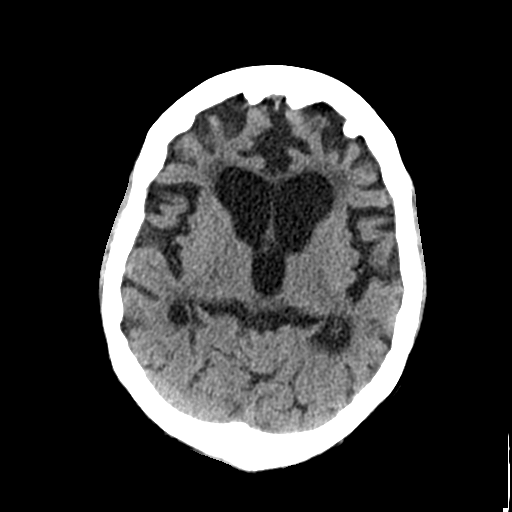
[im 14/31  bone]
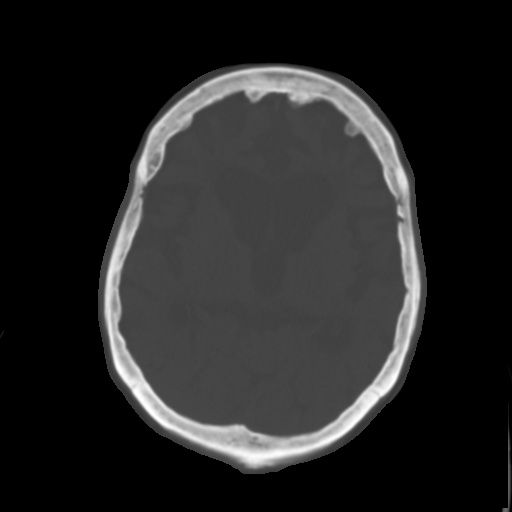
[im 17/31  brain]
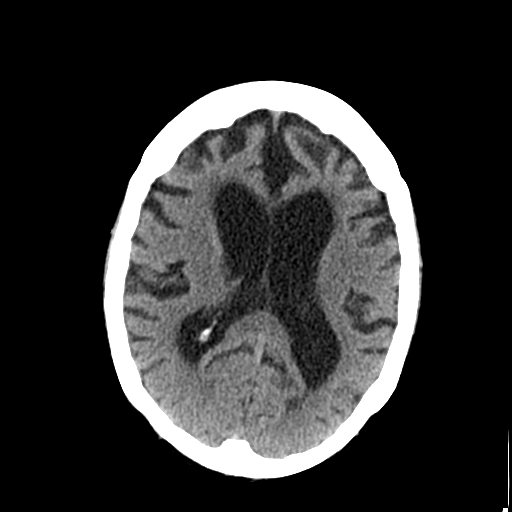
[im 20/31  brain]
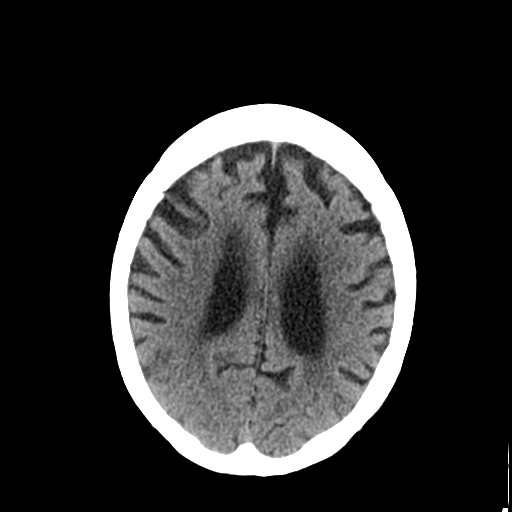
[im 23/31  brain]
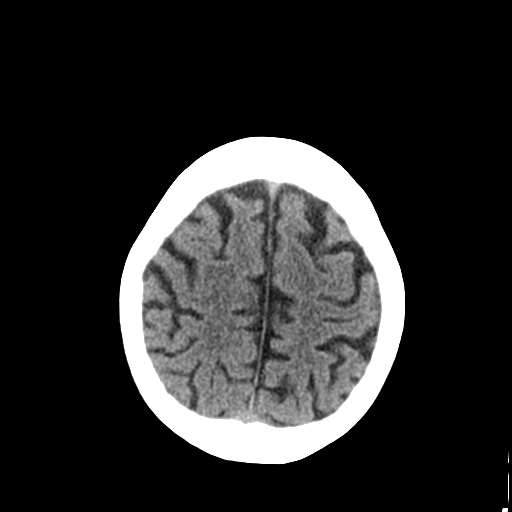
[im 25/31  brain]
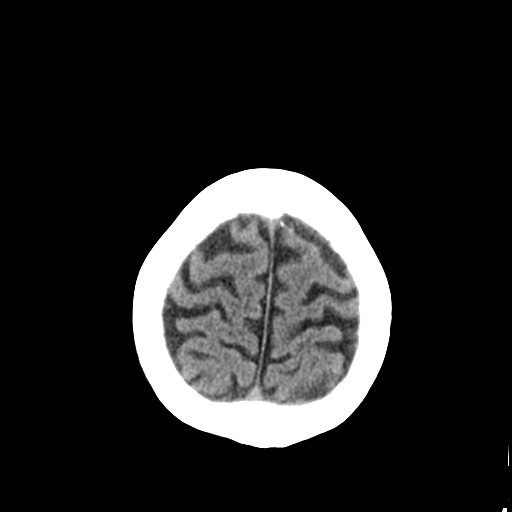
[im 25/31  bone]
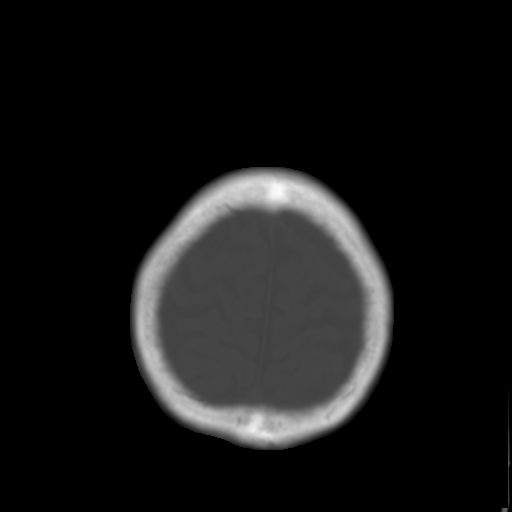
[im 28/31  brain]
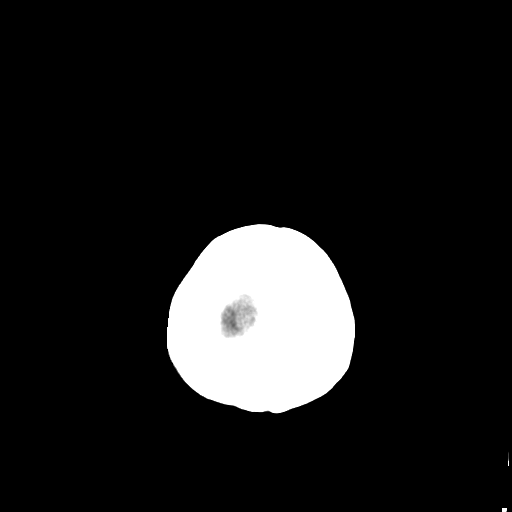

[Series 4: coronal soft · coronal · 0.32mm/px · 3 of 67 slices shown]
[im 23/67  brain]
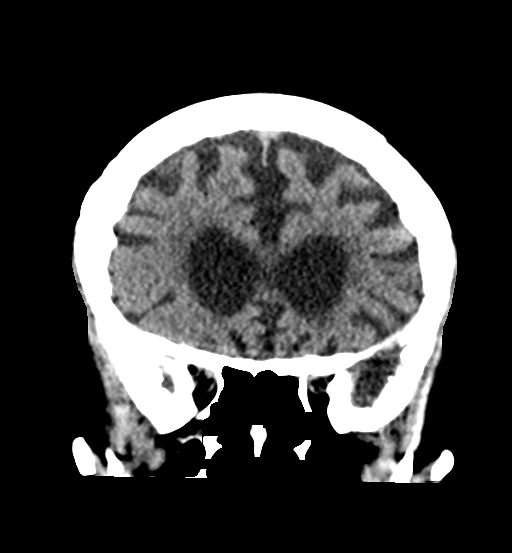
[im 30/67  brain]
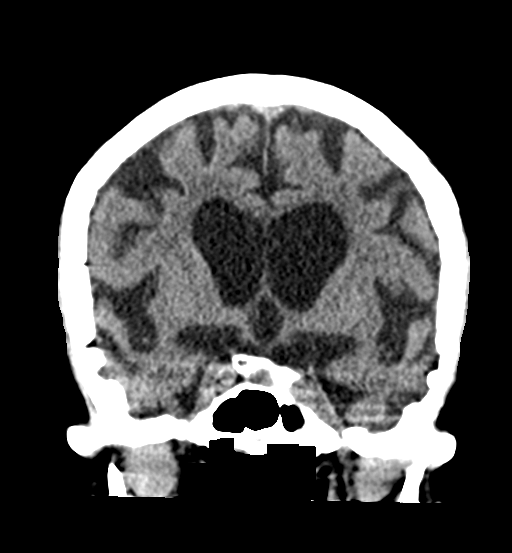
[im 37/67  brain]
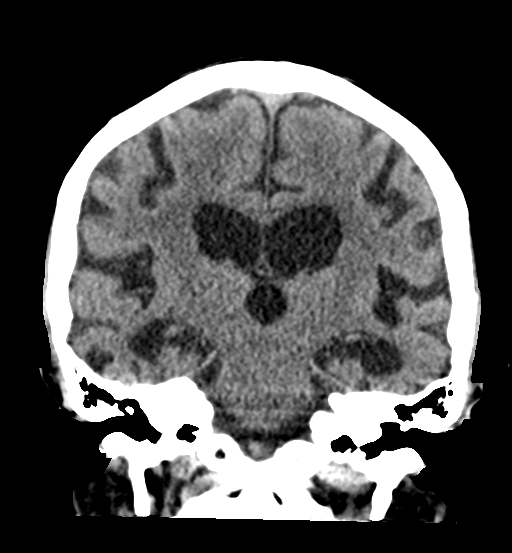

[Series 5: sagittal soft · sagittal · 0.34mm/px · 3 of 55 slices shown]
[im 19/55  brain]
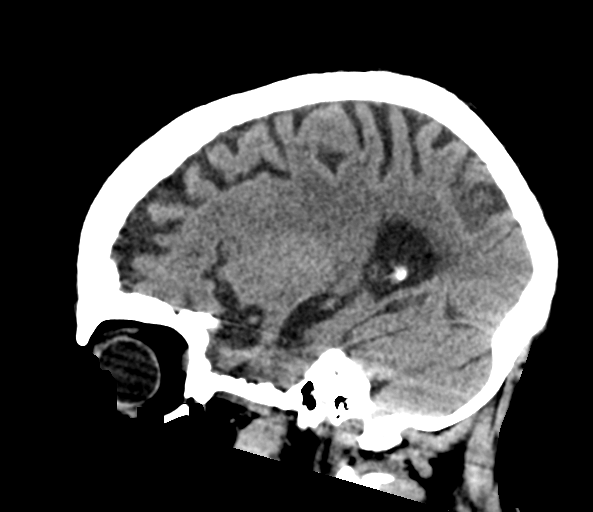
[im 28/55  brain]
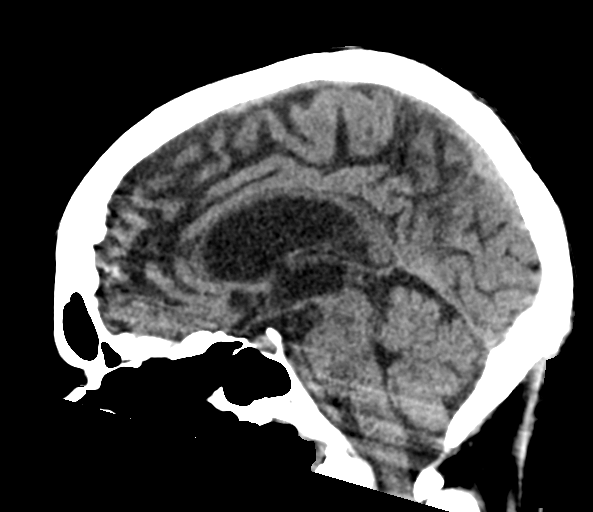
[im 37/55  brain]
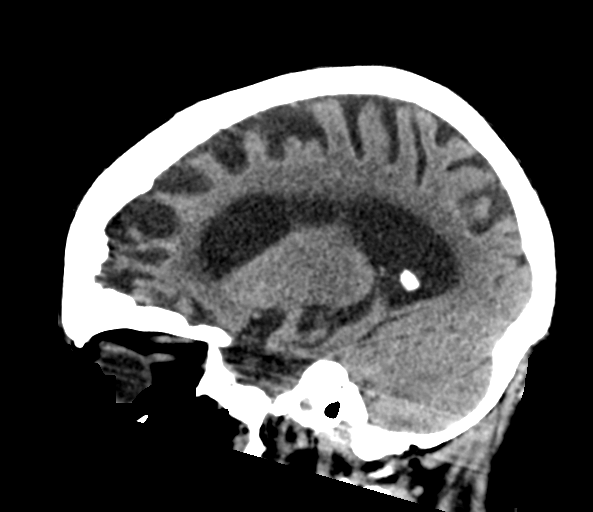

[16 of 47 positions shown; findings below may reference images not displayed]

FINDINGS: Brain: No acute territorial infarction, hemorrhage or intracranial
mass. Advanced atrophy. Enlarged ventricles likely due to atrophy.
Mild hypodensity in the white matter consistent with chronic small
vessel ischemic change.

Vascular: No hyperdense vessels.  Carotid vascular calcification

Skull: Normal. Negative for fracture or focal lesion.

Sinuses/Orbits: No acute finding.

Other: None
IMPRESSION: 1. No CT evidence for acute intracranial abnormality.
2. Atrophy and mild chronic small vessel ischemic changes of the
white matter.
